# Patient Record
Sex: Female | Born: 1970 | Race: White | Hispanic: No | Marital: Married | Smoking: Never smoker
Health system: Southern US, Community
[De-identification: ages and names within clinical notes are randomized; demographics above are authoritative.]

## PROBLEM LIST (undated history)

## (undated) DIAGNOSIS — E785 Hyperlipidemia, unspecified: Secondary | ICD-10-CM

## (undated) DIAGNOSIS — T7840XA Allergy, unspecified, initial encounter: Secondary | ICD-10-CM

## (undated) DIAGNOSIS — K602 Anal fissure, unspecified: Secondary | ICD-10-CM

## (undated) HISTORY — PX: AUGMENTATION MAMMAPLASTY: SUR837

## (undated) HISTORY — DX: Anal fissure, unspecified: K60.2

## (undated) HISTORY — DX: Allergy, unspecified, initial encounter: T78.40XA

## (undated) HISTORY — DX: Hyperlipidemia, unspecified: E78.5

## (undated) HISTORY — PX: ANAL FISSURECTOMY: SUR608

---

## 1999-10-03 ENCOUNTER — Inpatient Hospital Stay (HOSPITAL_COMMUNITY): Admission: AD | Admit: 1999-10-03 | Discharge: 1999-10-03 | Payer: Self-pay | Admitting: Obstetrics and Gynecology

## 1999-10-09 ENCOUNTER — Inpatient Hospital Stay (HOSPITAL_COMMUNITY): Admission: AD | Admit: 1999-10-09 | Discharge: 1999-10-13 | Payer: Self-pay | Admitting: *Deleted

## 2000-05-03 ENCOUNTER — Other Ambulatory Visit: Admission: RE | Admit: 2000-05-03 | Discharge: 2000-05-03 | Payer: Self-pay | Admitting: Gynecology

## 2001-04-11 ENCOUNTER — Ambulatory Visit (HOSPITAL_COMMUNITY): Admission: RE | Admit: 2001-04-11 | Discharge: 2001-04-11 | Payer: Self-pay | Admitting: General Surgery

## 2001-06-02 ENCOUNTER — Other Ambulatory Visit: Admission: RE | Admit: 2001-06-02 | Discharge: 2001-06-02 | Payer: Self-pay | Admitting: Gynecology

## 2002-06-05 ENCOUNTER — Other Ambulatory Visit: Admission: RE | Admit: 2002-06-05 | Discharge: 2002-06-05 | Payer: Self-pay | Admitting: Gynecology

## 2003-06-14 ENCOUNTER — Other Ambulatory Visit: Admission: RE | Admit: 2003-06-14 | Discharge: 2003-06-14 | Payer: Self-pay | Admitting: Gynecology

## 2004-07-07 ENCOUNTER — Other Ambulatory Visit: Admission: RE | Admit: 2004-07-07 | Discharge: 2004-07-07 | Payer: Self-pay | Admitting: Gynecology

## 2005-09-21 ENCOUNTER — Other Ambulatory Visit: Admission: RE | Admit: 2005-09-21 | Discharge: 2005-09-21 | Payer: Self-pay | Admitting: Gynecology

## 2007-09-27 ENCOUNTER — Other Ambulatory Visit: Admission: RE | Admit: 2007-09-27 | Discharge: 2007-09-27 | Payer: Self-pay | Admitting: Gynecology

## 2009-07-26 HISTORY — PX: ABDOMINAL HYSTERECTOMY: SHX81

## 2009-07-30 ENCOUNTER — Ambulatory Visit (HOSPITAL_COMMUNITY): Admission: RE | Admit: 2009-07-30 | Discharge: 2009-07-30 | Payer: Self-pay | Admitting: Obstetrics and Gynecology

## 2010-02-16 ENCOUNTER — Encounter (INDEPENDENT_AMBULATORY_CARE_PROVIDER_SITE_OTHER): Payer: Self-pay | Admitting: Obstetrics and Gynecology

## 2010-02-16 ENCOUNTER — Inpatient Hospital Stay (HOSPITAL_COMMUNITY): Admission: RE | Admit: 2010-02-16 | Discharge: 2010-02-19 | Payer: Self-pay | Admitting: Obstetrics and Gynecology

## 2010-10-10 LAB — CBC
HCT: 21.3 % — ABNORMAL LOW (ref 36.0–46.0)
HCT: 23.9 % — ABNORMAL LOW (ref 36.0–46.0)
HCT: 40 % (ref 36.0–46.0)
Hemoglobin: 13.8 g/dL (ref 12.0–15.0)
Hemoglobin: 7.4 g/dL — ABNORMAL LOW (ref 12.0–15.0)
Hemoglobin: 8.3 g/dL — ABNORMAL LOW (ref 12.0–15.0)
MCH: 32.5 pg (ref 26.0–34.0)
MCH: 32.9 pg (ref 26.0–34.0)
MCH: 33.1 pg (ref 26.0–34.0)
MCHC: 34.6 g/dL (ref 30.0–36.0)
MCHC: 34.7 g/dL (ref 30.0–36.0)
MCHC: 34.8 g/dL (ref 30.0–36.0)
MCV: 94 fL (ref 78.0–100.0)
MCV: 94.8 fL (ref 78.0–100.0)
MCV: 95.3 fL (ref 78.0–100.0)
Platelets: 114 10*3/uL — ABNORMAL LOW (ref 150–400)
Platelets: 131 10*3/uL — ABNORMAL LOW (ref 150–400)
Platelets: 214 10*3/uL (ref 150–400)
RBC: 2.24 MIL/uL — ABNORMAL LOW (ref 3.87–5.11)
RBC: 2.51 MIL/uL — ABNORMAL LOW (ref 3.87–5.11)
RBC: 4.25 MIL/uL (ref 3.87–5.11)
RDW: 12.6 % (ref 11.5–15.5)
RDW: 12.7 % (ref 11.5–15.5)
RDW: 13 % (ref 11.5–15.5)
WBC: 3.6 10*3/uL — ABNORMAL LOW (ref 4.0–10.5)
WBC: 5.4 10*3/uL (ref 4.0–10.5)
WBC: 6.9 10*3/uL (ref 4.0–10.5)

## 2010-10-10 LAB — BASIC METABOLIC PANEL
BUN: 2 mg/dL — ABNORMAL LOW (ref 6–23)
CO2: 29 mEq/L (ref 19–32)
Calcium: 7.9 mg/dL — ABNORMAL LOW (ref 8.4–10.5)
Chloride: 102 mEq/L (ref 96–112)
Creatinine, Ser: 0.62 mg/dL (ref 0.4–1.2)
GFR calc Af Amer: >60 mL/min (ref >60)
GFR calc non Af Amer: >60 mL/min (ref >60)
Glucose, Bld: 125 mg/dL — ABNORMAL HIGH (ref 70–99)
Potassium: 3.5 mEq/L (ref 3.5–5.1)
Sodium: 135 mEq/L (ref 135–145)

## 2010-10-10 LAB — DIFFERENTIAL
Basophils Absolute: 0 10*3/uL (ref 0.0–0.1)
Basophils Relative: 0 % (ref 0–1)
Eosinophils Absolute: 0.1 10*3/uL (ref 0.0–0.7)
Eosinophils Relative: 1 % (ref 0–5)
Lymphocytes Relative: 17 % (ref 12–46)
Lymphs Abs: 0.9 10*3/uL (ref 0.7–4.0)
Monocytes Absolute: 0.4 10*3/uL (ref 0.1–1.0)
Monocytes Relative: 8 % (ref 3–12)
Neutro Abs: 4 10*3/uL (ref 1.7–7.7)
Neutrophils Relative %: 74 % (ref 43–77)

## 2010-10-10 LAB — LIPID PANEL
Cholesterol: 218 mg/dL — ABNORMAL HIGH (ref 0–200)
HDL: 80 mg/dL (ref >39)
LDL Cholesterol: 130 mg/dL — ABNORMAL HIGH (ref 0–99)
Total CHOL/HDL Ratio: 2.7 RATIO
Triglycerides: 42 mg/dL (ref <150)
VLDL: 8 mg/dL (ref 0–40)

## 2010-10-10 LAB — PREGNANCY, URINE: Preg Test, Ur: NEGATIVE

## 2010-10-10 LAB — SURGICAL PCR SCREEN
MRSA, PCR: NEGATIVE
Staphylococcus aureus: NEGATIVE

## 2010-10-11 LAB — CBC
HCT: 38.5 % (ref 36.0–46.0)
Hemoglobin: 13.3 g/dL (ref 12.0–15.0)
MCHC: 34.5 g/dL (ref 30.0–36.0)
MCV: 94.6 fL (ref 78.0–100.0)
Platelets: 204 10*3/uL (ref 150–400)
RBC: 4.07 MIL/uL (ref 3.87–5.11)
RDW: 13.1 % (ref 11.5–15.5)
WBC: 4.2 10*3/uL (ref 4.0–10.5)

## 2010-10-11 LAB — PREGNANCY, URINE: Preg Test, Ur: NEGATIVE

## 2010-12-11 NOTE — Discharge Summary (Signed)
[facility]  Patient:    Meghan Gonzales                      MRN: [id number] Adm. Date:  [id number] Disc. Date: [id number] Attending:  Osborn Coho Dictator:   Leilani Able, P.A.                           Discharge Summary  FINAL DIAGNOSES:              1. Intrauterine pregnancy at 40 weeks estimated                                  gestational age.                               2. Cephalopelvic disproportion.  PROCEDURE:                    Primary low transverse cesarean section.  SURGEON:                      Mark E. Dareen Piano, M.D.  COMPLICATIONS:                None.  HISTORY OF PRESENT ILLNESS:   This 40 year old, gravida 3, para 0-0-2-0, presents at 40 weeks complaining of contractions.  While in the emergency room, the patient had spontaneous rupture of membranes with moderate meconium stained fluid.  The  patients prenatal course has been complicated by history of two spontaneous miscarriages.  The patient was treated with prostaglandin during the first trimester of this pregnancy for a luteal phase defect.  She was also noted to be Group B Strep positive.  She was admitted at this time and started on antibiotics for the Group B Strep and amnioinfusion was begun.  The patient had a very protracted labor curve.  She was having severe pain at the neck at this point. She was 9 cm dilated for about three hours.  The patient had been having this pain n the neck and was better after the epidural was placed.  Within several hours she began to complain of severe neck pain again.  We were not sure of the etiology. At this time it was discussed with the patient about cesarean section.  The patient decided to proceed with a cesarean section.  HOSPITAL COURSE:              She was taken to the operating room by [facility] E. Dareen Piano, M.D. where a primary low transverse cesarean section was performed with the delivery of an 8 pound 13  ounce female infant with Apgars of 8 and 9. Delivery went without complications.  The patients postoperative course was benign without significant fevers.  Her neck pain resolved.  She was felt ready for discharge n postoperative day #2.  She was sent home on postoperative day #3.  The patient as sent home on a regular diet, told to decrease activities, told to continue prenatal vitamins, and given Tylox one to two every four hours as needed for pain.  Also  told she could take some Motrin.  The patient is to follow up in the office in our weeks.  DISCHARGE LABORATORY DATA:    The patient had a hemoglobin of 10.6, white  blood  cell count of 13.6. DD:  10/30/99 TD:  10/30/99 Job: [id number]

## 2010-12-11 NOTE — Op Note (Signed)
[facility]  Patient:    Meghan Gonzales Visit Number: [id number] MRN: [id number]          Service Type: DSU Location: DAY Attending Physician:  Carson Myrtle Dictated by:   Sheppard Plumber Earlene Plater, M.D. Proc. Date: 04/11/01 Admit Date:  04/11/2001   CC:         [name]. Mezer, M.D.   Operative Report  PREOPERATIVE DIAGNOSIS:  Anal fissure.  POSTOPERATIVE DIAGNOSIS:  Multiple anal fissures.  OPERATION:  Repair of anal fissure.  SURGEON:  Timothy E. Earlene Plater, M.D.  ANESTHESIA:  General  INDICATION FOR PROCEDURE:  [name] was referred to me by Dr. Chevis Pretty. Apparently she has had symptoms of anal fissure since the spring of 2002. After a trial of conservative management, which she failed she has agreed to proceed with surgery and this has been carefully explained.  There is a question of irritable bowel syndrome.  DESCRIPTION OF PROCEDURE:  The patient was brought to the operating room and placed supine. Mask anesthesia provided.  She was placed in lithotomy.  Area inspected and prepped and draped in the usual fashion.  Fissures were present in the 10, 12, and 6 oclock positions.  When seen in the office, she only had a posterior anal fissure.  I have to consider concurrent inflammatory bowel disease and/or trauma.  The sphincters were intact.  Anoscopy was otherwise negative.  The three fissures were lightly cauterized.  Marcaine 0.5% with epinephrine was injected around about the anal orifice.  A left posterior internal sphincterotomy carefully performed percutaneously with a 15 blade. The external sphincter remained intact.  There was no bleeding or complication.  Gelfoam gauze and a dry sterile dressing was applied.  She tolerated it well.  She was removed to recovery room in good condition.  Written and verbal instructions were carefully given to her and her husband including Ambien 10 mg, #15 one at night if needed for sleep and  Darvocet #36, refill one for pain. Dictated by:   Sheppard Plumber Earlene Plater, M.D. Attending Physician:  Carson Myrtle DD:  04/11/01 TD:  04/11/01 Job: [id number]

## 2010-12-11 NOTE — Op Note (Signed)
[facility]  Patient:    Meghan Gonzales                      MRN: [id number] Adm. Date:  [id number] Attending:  Osborn Coho CC:         [name]. Mezer, M.D.                           Operative Report  PREOPERATIVE DIAGNOSES:       1. Intrauterine pregnancy at 40-weeks estimated                                  gestational age.                               2. Cephalopelvic disproportion.  POSTOPERATIVE DIAGNOSES:      1. Intrauterine pregnancy at 40-weeks estimated                                  gestational age.                               2. Cephalopelvic disproportion.  PROCEDURE:                    Primary low transverse cesarean section.  SURGEON:                      Mark E. Dareen Piano, M.D.  ANESTHESIA:                   Epidural and spinal.  ESTIMATED BLOOD LOSS:         900 cc.  COMPLICATIONS:                None.  FINDINGS:                     Patient had a normal-appearing uterus, fallopian tubes and ovaries.  The placenta appeared to be normal.  The baby had Apgars of 9 at one minute and 9 at five minutes.  DESCRIPTION OF PROCEDURE:     Patient was taken to the operating room where a spinal anesthetic was placed because of inadequate pain relief from an epidural. Once the spinal dose was set up, the patient was prepped with Hibiclens and draped in the usual fashion for this procedure.  A Foley catheter had previously been placed.  A Pfannenstiel incision was made 2 cm above the pubic symphysis.  This was carried down to the fascia.  The fascia was entered in the midline and extended  laterally with the Mayo scissors.  Rectus muscles were then sharply dissected from the fascia.  Rectus muscle was divided in the midline and taken superiorly and inferiorly.  Parietal peritoneum was entered sharply.  A bladder flap was taken  down sharply.  A low transverse uterine incision was made in the midline and extended  laterally with blunt dissection.  Amniotic fluid appeared to be lightly stained with meconium.  On delivery of the head, oropharynx and nostrils were bulb-suctioned.  The remaining infant was then delivered, the cord was doubly clamped and cut and the infant handed  to the awaiting NICU team.  Cord blood was then obtained.  The placenta was then manually removed.  The uterus was exteriorized and uterine cavity wiped with a wet lap.  The uterine incision was  closed in a single layer of 0 chromic in a running-locking fashion.  Bladder flap and parietal peritoneum were not closed.  The posterior cul-de-sac and abdominal gutters were irrigated.  Hemostasis was checked and felt to be adequate.  The fascia was then closed using 0 Monocryl in a running fashion.  Subcuticular tissue was made hemostatic with a Bovie.  Stainless steel clips were used to close the  skin.  Patient tolerated the procedure well.  She was taken to the recovery room in stable condition.  Instrument and lap counts were correct x 2. DD:  10/10/99 TD:  10/12/99 Job: [id number]

## 2011-09-21 ENCOUNTER — Ambulatory Visit (INDEPENDENT_AMBULATORY_CARE_PROVIDER_SITE_OTHER): Payer: BC Managed Care – PPO | Admitting: General Surgery

## 2011-09-21 ENCOUNTER — Encounter (INDEPENDENT_AMBULATORY_CARE_PROVIDER_SITE_OTHER): Payer: Self-pay | Admitting: General Surgery

## 2011-09-21 VITALS — BP 106/74 | HR 70 | Temp 97.8°F | Resp 16 | Ht 71.0 in | Wt 154.8 lb

## 2011-09-21 DIAGNOSIS — K602 Anal fissure, unspecified: Secondary | ICD-10-CM | POA: Insufficient documentation

## 2011-09-21 NOTE — Patient Instructions (Signed)
Baby wipes after bm's Warm tub soaks 2-3 times per day Diltiazem cream to rectum 4 times a day Stool softener for next couple weeks

## 2011-10-05 NOTE — Progress Notes (Signed)
Subjective:     Patient ID: Meghan Gonzales, female   DOB: [birth year], 41 y.o.   MRN: [id number]  HPI We are asked to see the patient in consultation by Dr. Jerre Simon to evaluate her for an anal fissure. The patient is a 41 year old female who has been experiencing perirectal pain since about 2002. The pain seems to come and go. At the end of the summer she developed 4 days of constipation and hard bowel movements. She has been bleeding with her bowel movements now since about August. She denies any fevers or chills. No chest pain or shortness of breath. Her constipation is now under control.  Review of Systems  Constitutional: Negative.   HENT: Negative.   Eyes: Negative.   Respiratory: Negative.   Cardiovascular: Negative.   Gastrointestinal: Positive for anal bleeding and rectal pain.  Genitourinary: Negative.   Musculoskeletal: Negative.   Skin: Negative.   Neurological: Negative.   Hematological: Negative.   Psychiatric/Behavioral: Negative.        Objective:   Physical Exam  Constitutional: She is oriented to person, place, and time. She appears well-developed and well-nourished.  HENT:  Head: Normocephalic and atraumatic.  Eyes: Conjunctivae and EOM are normal. Pupils are equal, round, and reactive to light.  Neck: Normal range of motion. Neck supple.  Cardiovascular: Normal rate, regular rhythm and normal heart sounds.   Pulmonary/Chest: Effort normal and breath sounds normal.  Abdominal: Soft. Bowel sounds are normal.  Genitourinary:       Her perirectal skin looks good. She does have a visible fissure posteriorly  Musculoskeletal: Normal range of motion.  Neurological: She is alert and oriented to person, place, and time.  Skin: Skin is warm and dry.  Psychiatric: She has a normal mood and affect. Her behavior is normal.       Assessment:     Posterior anal fissure    Plan:     I recommended some local wound care measures as well as diltiazem cream to  place on the anal verge 4 times a day. We will plan to see her back in one month to check her progress.

## 2011-10-19 ENCOUNTER — Encounter (INDEPENDENT_AMBULATORY_CARE_PROVIDER_SITE_OTHER): Payer: BC Managed Care – PPO | Admitting: General Surgery

## 2011-11-25 ENCOUNTER — Encounter (INDEPENDENT_AMBULATORY_CARE_PROVIDER_SITE_OTHER): Payer: Self-pay | Admitting: General Surgery

## 2011-11-25 ENCOUNTER — Ambulatory Visit (INDEPENDENT_AMBULATORY_CARE_PROVIDER_SITE_OTHER): Payer: BC Managed Care – PPO | Admitting: General Surgery

## 2011-11-25 VITALS — BP 98/70 | HR 68 | Temp 97.8°F | Resp 12 | Ht 71.0 in | Wt 153.6 lb

## 2011-11-25 DIAGNOSIS — K602 Anal fissure, unspecified: Secondary | ICD-10-CM

## 2011-11-25 MED ORDER — NITROGLYCERIN 0.4 % RE OINT: 1.0000 "application " | TOPICAL_OINTMENT | Freq: Four times a day (QID) | RECTAL | Status: DC

## 2011-11-25 NOTE — Patient Instructions (Signed)
Try nitroglycerin or aquaphor miralax for constipation

## 2011-11-25 NOTE — Progress Notes (Signed)
Subjective:     Patient ID: Meghan Gonzales, female   DOB: 1971/04/05, 41 y.o.   MRN: [id number]  HPI The patient is a 41 year old white female who we saw just over a month ago with a posterior anal fissure. We treated her with diltiazem cream. She uses for about a month and then has not used it much since then. She claims that it causes some burning when she applies it. She is improved from when we saw her originally. She only occasionally experiences bleeding with her bowel movements and when she does have pain it is significantly less.  Review of Systems     Objective:   Physical Exam On rectal exam she still has some evidence of a posterior anal fissure but it actually appears to be healing.    Assessment:     Posterior anal fissure    Plan:     Since the diltiazem cream seems to cause some burning for her we will try switching her to a nitroglycerin cream and see if this helps. We will continue this for about a month and see how much more she can improve. If the nitroglycerin does not seem to work for her she can try displacing some Aquaphor back there on a daily basis.

## 2011-12-27 ENCOUNTER — Encounter (INDEPENDENT_AMBULATORY_CARE_PROVIDER_SITE_OTHER): Payer: BC Managed Care – PPO | Admitting: General Surgery

## 2012-02-10 ENCOUNTER — Encounter (INDEPENDENT_AMBULATORY_CARE_PROVIDER_SITE_OTHER): Payer: BC Managed Care – PPO | Admitting: General Surgery

## 2012-02-16 ENCOUNTER — Telehealth: Payer: Self-pay | Admitting: Internal Medicine

## 2012-02-16 NOTE — Telephone Encounter (Signed)
Spoke with Clydie Braun and scheduled patient on 02/21/12 at 10:30 AM

## 2012-02-16 NOTE — Telephone Encounter (Signed)
Left a message for Clydie Braun to call me.

## 2012-02-21 ENCOUNTER — Encounter: Payer: Self-pay | Admitting: Gastroenterology

## 2012-02-21 ENCOUNTER — Encounter: Payer: Self-pay | Admitting: Nurse Practitioner

## 2012-02-21 ENCOUNTER — Ambulatory Visit (INDEPENDENT_AMBULATORY_CARE_PROVIDER_SITE_OTHER): Payer: Self-pay | Admitting: Nurse Practitioner

## 2012-02-21 VITALS — BP 96/66 | HR 68 | Ht 71.0 in | Wt 153.4 lb

## 2012-02-21 DIAGNOSIS — Z8371 Family history of colonic polyps: Secondary | ICD-10-CM | POA: Insufficient documentation

## 2012-02-21 DIAGNOSIS — K625 Hemorrhage of anus and rectum: Secondary | ICD-10-CM | POA: Insufficient documentation

## 2012-02-21 MED ORDER — MOVIPREP 100 G PO SOLR: 1.0000 | Freq: Once | ORAL | Status: AC

## 2012-02-21 NOTE — Patient Instructions (Addendum)
We have scheduled the colonosocopy with Dr. Russella Dar. Directions provided. We sent a prescription to [facility] for the Moviprep.  Colonoscopy A colonoscopy is an exam to evaluate your entire colon. In this exam, your colon is cleansed. A long fiberoptic tube is inserted through your rectum and into your colon. The fiberoptic scope (endoscope) is a long bundle of enclosed and very flexible fibers. These fibers transmit light to the area examined and send images from that area to your caregiver. Discomfort is usually minimal. You may be given a drug to help you sleep (sedative) during or prior to the procedure. This exam helps to detect lumps (tumors), polyps, inflammation, and areas of bleeding. Your caregiver may also take a small piece of tissue (biopsy) that will be examined under a microscope. LET YOUR CAREGIVER KNOW ABOUT:   Allergies to food or medicine.   Medicines taken, including vitamins, herbs, eyedrops, over-the-counter medicines, and creams.   Use of steroids (by mouth or creams).   Previous problems with anesthetics or numbing medicines.   History of bleeding problems or blood clots.   Previous surgery.   Other health problems, including diabetes and kidney problems.   Possibility of pregnancy, if this applies.  BEFORE THE PROCEDURE   A clear liquid diet may be required for 2 days before the exam.   Ask your caregiver about changing or stopping your regular medications.   Liquid injections (enemas) or laxatives may be required.   A large amount of electrolyte solution may be given to you to drink over a short period of time. This solution is used to clean out your colon.   You should be present 60 minutes prior to your procedure or as directed by your caregiver.  AFTER THE PROCEDURE   If you received a sedative or pain relieving medication, you will need to arrange for someone to drive you home.   Occasionally, there is a little blood passed with the first  bowel movement. Do not be concerned.  FINDING OUT THE RESULTS OF YOUR TEST Not all test results are available during your visit. If your test results are not back during the visit, make an appointment with your caregiver to find out the results. Do not assume everything is normal if you have not heard from your caregiver or the medical facility. It is important for you to follow up on all of your test results. HOME CARE INSTRUCTIONS   It is not unusual to pass moderate amounts of gas and experience mild abdominal cramping following the procedure. This is due to air being used to inflate your colon during the exam. Walking or a warm pack on your belly (abdomen) may help.   You may resume all normal meals and activities after sedatives and medicines have worn off.   Only take over-the-counter or prescription medicines for pain, discomfort, or fever as directed by your caregiver. Do not use aspirin or blood thinners if a biopsy was taken. Consult your caregiver for medicine usage if biopsies were taken.  SEEK IMMEDIATE MEDICAL CARE IF:   You have a fever.   You pass large blood clots or fill a toilet with blood following the procedure. This may also occur 10 to 14 days following the procedure. This is more likely if a biopsy was taken.   You develop abdominal pain that keeps getting worse and cannot be relieved with medicine.  Document Released: 07/09/2000 Document Revised: 07/01/2011 Document Reviewed: 02/22/2008 [facility] Patient Information 2012 [place], [place].

## 2012-02-21 NOTE — Progress Notes (Signed)
02/21/2012 Meghan Gonzales [id number] [birth year]   HISTORY OF PRESENT ILLNESS: Patient is a healthy 41 year old female, new to this practice, here for evaluation of rectal bleeding. Approximately 12 years ago after giving birth, patient developed several anal fissures. She ultimately underwent surgery by Dr. Earlene Plater who has since retired. Patient did well until about a year ago when she developed another fissure following a bout of constipation. No further constipation, patient is taking stool softeners 3 times a week. Patient saw surgery (Dr. Carolynne Edouard)  in late May of this year and was tried on diltiazem followed by nitroglycerin. Overall she is doing okay right now. Patient is concerned about the frequent episodes of rectal bleeding which she has been attributing to fissure for the last year. Patient's mother had colon polyps in her forties.   Past Medical History  Diagnosis Date  . Hyperlipidemia   . Constipation   . Anal fissure    Past Surgical History  Procedure Date  . Abdominal hysterectomy 2011  . Cesarean section 10/10/1999  . Anal fissurectomy     reports that she has never smoked. She has never used smokeless tobacco. She reports that she drinks alcohol. She reports that she does not use illicit drugs. family history includes Diabetes in her father. No Known Allergies    Outpatient Encounter Prescriptions as of 02/21/2012  Medication Sig Dispense Refill  . fish oil-omega-3 fatty acids 1000 MG capsule Take 2 g by mouth 3 (three) times daily.      . Multiple Vitamin (MULTIVITAMIN) tablet Take 1 tablet by mouth daily.      Marland Kitchen DISCONTD: Nitroglycerin 0.4 % OINT Place 1 application rectally 4 (four) times daily.  30 g  3     REVIEW OF SYSTEMS  : All other systems reviewed and negative except where noted in the History of Present Illness.   PHYSICAL EXAM: BP 96/66  Pulse 68  Ht 5\' 11"  (1.803 m)  Wt 153 lb 6.4 oz (69.582 kg)  BMI 21.40 kg/m2 General: Well developed white female in  no acute distress Head: Normocephalic and atraumatic Eyes:  sclerae anicteric,conjunctive pink. Ears: Normal auditory acuity Mouth: No deformity or lesions Neck: Supple, no masses.  Lungs: Clear throughout to auscultation Heart: Regular rate and rhythm; no murmurs heard Abdomen: Soft, non distended, nontender. No masses or hepatomegaly noted. Normal Bowel sounds Rectal: no fissure appreciated Musculoskeletal: Symmetrical with no gross deformities  Skin: No lesions on visible extremities Extremities: No edema or deformities noted Neurological: Alert oriented x 4, grossly nonfocal Cervical Nodes:  No significant cervical adenopathy Psychological:  Alert and cooperative. Normal mood and affect  ASSESSMENT AND PLAN:   1. Rectal bleeding. Though I cannot appreciate a fissure on today's exam, she has had problems with seizures in the past, her last one was in the spring. Bleeding probably perianal in nature but given family history of colon polyps in mother it would be reasonable to proceed with colonoscopy to rule out other sources of bleeding such as polyps/neoplasm.  2. history of anal fissures, patient evaluated by surgery late May and was treated with diltiazem followed by nitroglycerin. I do not appreciate a fissure on today's exam and she is not having fissure type pain.

## 2012-02-21 NOTE — Progress Notes (Signed)
Reviewed and agree with management plan. Jos Cygan T. Melvin Whiteford MD FACG 

## 2012-02-22 ENCOUNTER — Encounter (INDEPENDENT_AMBULATORY_CARE_PROVIDER_SITE_OTHER): Payer: BC Managed Care – PPO | Admitting: General Surgery

## 2012-02-22 ENCOUNTER — Telehealth: Payer: Self-pay | Admitting: *Deleted

## 2012-02-22 NOTE — Telephone Encounter (Signed)
Called and left a message for the pt. I advised the CRNA looked at the list of medications used to sedate her when she had a procedure with Dr. Benna Dunks.  Versed, Demerol, Phentynol, Deprovan ( propofol), Decadron ( steroid) Sevo Flurone, Zofran.  John the CRNA said none of these medications are know to cause hair loss.  I asked her to  Call and let me know she got my message.

## 2012-02-25 ENCOUNTER — Telehealth: Payer: Self-pay | Admitting: *Deleted

## 2012-02-25 NOTE — Telephone Encounter (Signed)
The patient called me back after I had left her a message earlier this week.  I advised her I spoke to one of the CRNA's here. I had him read the list of meds that were used to sedate her when she had a procedure with Dr. Benna Dunks.  I read her the list that they emailed to me with her permission.  The CRNA said none of the meds on that list are associated with hair loss that he knew. She is going to call Dr Lynnell Dike office  And try to get the meds that were used when she had the two Ablations.

## 2012-03-27 ENCOUNTER — Telehealth: Payer: Self-pay | Admitting: Internal Medicine

## 2012-03-27 NOTE — Telephone Encounter (Signed)
Having seasonal allergies - ? Ok to use benadryl prior to colonoscopy   Ok to do so

## 2012-03-28 ENCOUNTER — Encounter: Payer: Self-pay | Admitting: Gastroenterology

## 2012-03-28 ENCOUNTER — Ambulatory Visit (AMBULATORY_SURGERY_CENTER): Payer: BC Managed Care – PPO | Admitting: Gastroenterology

## 2012-03-28 VITALS — BP 95/66 | HR 57 | Temp 96.0°F | Resp 18 | Ht 71.0 in | Wt 153.0 lb

## 2012-03-28 DIAGNOSIS — K625 Hemorrhage of anus and rectum: Secondary | ICD-10-CM

## 2012-03-28 DIAGNOSIS — D126 Benign neoplasm of colon, unspecified: Secondary | ICD-10-CM

## 2012-03-28 DIAGNOSIS — Z8371 Family history of colonic polyps: Secondary | ICD-10-CM

## 2012-03-28 DIAGNOSIS — K921 Melena: Secondary | ICD-10-CM

## 2012-03-28 MED ORDER — SODIUM CHLORIDE 0.9 % IV SOLN: 500.0000 mL | INTRAVENOUS | Status: DC

## 2012-03-28 NOTE — Progress Notes (Signed)
Patient did not experience any of the following events: a burn prior to discharge; a fall within the facility; wrong site/side/patient/procedure/implant event; or a hospital transfer or hospital admission upon discharge from the facility. (G8907) Patient did not have preoperative order for IV antibiotic SSI prophylaxis. (G8918)  

## 2012-03-28 NOTE — Op Note (Signed)
[facility] [address] [place], [address]   COLONOSCOPY PROCEDURE REPORT  PATIENT: Meghan Gonzales, Meghan Gonzales  MR#: [id number] BIRTHDATE: 10-31-1970 , 41  yrs. old GENDER: Female ENDOSCOPIST: Meryl Dare, MD, [facility] REFERRED [id number] Vincente Poli, M.D. PROCEDURE DATE:  03/28/2012 PROCEDURE:   Colonoscopy with snare polypectomy ASA CLASS:   Class II INDICATIONS:hematochezia. MEDICATIONS: MAC sedation, administered by CRNA and propofol (Diprivan) 250mg  IV DESCRIPTION OF PROCEDURE:   After the risks benefits and alternatives of the procedure were thoroughly explained, informed consent was obtained.  A digital rectal exam revealed no abnormalities of the rectum.   The LB [id number]  endoscope was introduced through the anus and advanced to the cecum, which was identified by both the appendix and ileocecal valve. No adverse events experienced.   The quality of the prep was excellent, using MoviPrep  The instrument was then slowly withdrawn as the colon was fully examined.   COLON FINDINGS: A sessile polyp measuring 7 mm in size was found in the transverse colon.  A polypectomy was performed using snare cautery.  The resection was complete and the polyp tissue was completely retrieved.   A sessile polyp measuring 5 mm in size was found in the transverse colon.  A polypectomy was performed with a cold snare.  The resection was complete and the polyp tissue was completely retrieved.   A sessile polyp measuring 5 mm in size was found in the descending colon.  A polypectomy was performed with a cold snare.  The resection was complete and the polyp tissue was not retrieved.   The colon was otherwise normal.  There was no diverticulosis, inflamation, polyps or cancers unless previously stated.  Retroflexed views revealed internal hemorrhoids. The time to cecum=3 minutes 19 seconds.  Withdrawal time=10 minutes 45 seconds.  The scope was withdrawn and the procedure  completed. COMPLICATIONS: There were no complications.  ENDOSCOPIC IMPRESSION: 1.   Sessile polyp in the transverse colon; polypectomy performed 2.   Sessile polyp in the transverse colon; polypectomy performed 3.   Sessile polyp in the descending colon; polypectomy performed 4.   Small internal hemorrhoids  RECOMMENDATIONS: 1.  Hold aspirin, aspirin products, and anti-inflammatory medication for 2 weeks. 2.  Await pathology results 3.  Repeat colonoscopy in 5 years if polyp adenomatous; otherwise 10 years eSigned:  Meryl Dare, MD, [facility] 03/28/2012 11:47 AM

## 2012-03-28 NOTE — Patient Instructions (Signed)
YOU HAD AN ENDOSCOPIC PROCEDURE TODAY AT THE [facility]: Refer to the procedure report that was given to you for any specific questions about what was found during the examination.  If the procedure report does not answer your questions, please call your gastroenterologist to clarify.  If you requested that your care partner not be given the details of your procedure findings, then the procedure report has been included in a sealed envelope for you to review at your convenience later.  YOU SHOULD EXPECT: Some feelings of bloating in the abdomen. Passage of more gas than usual.  Walking can help get rid of the air that was put into your GI tract during the procedure and reduce the bloating. If you had a lower endoscopy (such as a colonoscopy or flexible sigmoidoscopy) you may notice spotting of blood in your stool or on the toilet paper. If you underwent a bowel prep for your procedure, then you may not have a normal bowel movement for a few days.  DIET: Your first meal following the procedure should be a light meal and then it is ok to progress to your normal diet.  A half-sandwich or bowl of soup is an example of a good first meal.  Heavy or fried foods are harder to digest and may make you feel nauseous or bloated.  Likewise meals heavy in dairy and vegetables can cause extra gas to form and this can also increase the bloating.  Drink plenty of fluids but you should avoid alcoholic beverages for 24 hours.  ACTIVITY: Your care partner should take you home directly after the procedure.  You should plan to take it easy, moving slowly for the rest of the day.  You can resume normal activity the day after the procedure however you should NOT DRIVE or use heavy machinery for 24 hours (because of the sedation medicines used during the test).    SYMPTOMS TO REPORT IMMEDIATELY: A gastroenterologist can be reached at any hour.  During normal business hours, 8:30 AM to 5:00 PM Monday through Friday,  call [phone].  After hours and on weekends, please call the GI answering service at [phone] who will take a message and have the physician on call contact you.   Following lower endoscopy (colonoscopy or flexible sigmoidoscopy):  Excessive amounts of blood in the stool  Significant tenderness or worsening of abdominal pains  Swelling of the abdomen that is new, acute  Fever of 100F or higher   FOLLOW UP: If any biopsies were taken you will be contacted by phone or by letter within the next 1-3 weeks.  Call your gastroenterologist if you have not heard about the biopsies in 3 weeks.  Our staff will call the home number listed on your records the next business day following your procedure to check on you and address any questions or concerns that you may have at that time regarding the information given to you following your procedure. This is a courtesy call and so if there is no answer at the home number and we have not heard from you through the emergency physician on call, we will assume that you have returned to your regular daily activities without incident.  SIGNATURES/CONFIDENTIALITY: You and/or your care partner have signed paperwork which will be entered into your electronic medical record.  These signatures attest to the fact that that the information above on your After Visit Summary has been reviewed and is understood.  Full responsibility of the confidentiality of   this discharge information lies with you and/or your care-partner.     HOLD ASPIRIN ,ASPIRIN PRODUCTS, & ANTI INFLAMMATORY PRODUCTS FOR 2 WEEKS  AWAIT LETTER FROM DR Russella Dar WITH PATHOLOGY RESULTS

## 2012-03-29 ENCOUNTER — Telehealth: Payer: Self-pay | Admitting: *Deleted

## 2012-03-29 NOTE — Telephone Encounter (Signed)
Pt. Called office to see if she should resume her "fish oil" following procedure yesterday.  Advised that she could resume her  Dose of fish oil.  Pt. Verbalized understanding.

## 2012-03-29 NOTE — Telephone Encounter (Signed)
No answer.  Left message to call office if questions or concerns. 

## 2012-04-11 ENCOUNTER — Other Ambulatory Visit: Payer: Self-pay | Admitting: Obstetrics and Gynecology

## 2012-04-12 ENCOUNTER — Encounter: Payer: Self-pay | Admitting: Gastroenterology

## 2012-04-14 ENCOUNTER — Other Ambulatory Visit: Payer: Self-pay | Admitting: Obstetrics and Gynecology

## 2012-04-14 DIAGNOSIS — R928 Other abnormal and inconclusive findings on diagnostic imaging of breast: Secondary | ICD-10-CM

## 2012-04-28 ENCOUNTER — Telehealth: Payer: Self-pay | Admitting: Gastroenterology

## 2012-04-28 NOTE — Telephone Encounter (Signed)
Patient did not get a copy of the letter.  I have reviewed the results and sent another letter

## 2012-05-01 ENCOUNTER — Ambulatory Visit
Admission: RE | Admit: 2012-05-01 | Discharge: 2012-05-01 | Disposition: A | Discharge status: Home / Self Care | Payer: BC Managed Care – PPO | Source: Ambulatory Visit | Attending: Obstetrics and Gynecology | Admitting: Obstetrics and Gynecology

## 2012-05-01 DIAGNOSIS — R928 Other abnormal and inconclusive findings on diagnostic imaging of breast: Secondary | ICD-10-CM

## 2012-08-09 ENCOUNTER — Telehealth (INDEPENDENT_AMBULATORY_CARE_PROVIDER_SITE_OTHER): Payer: Self-pay

## 2012-08-09 NOTE — Telephone Encounter (Signed)
The pt called in to schedule an appointment to see Dr Carolynne Edouard again.  She has seen him before for a fissure.  She was told by her GI that she has 3 hemorrhoids.  She has some bleeding.  She also complains of perianal itching and asked for advice.  I asked her to do warm tub soaks at least 3 times a day, keep her skin clean and dry, and  avoid foods containing citrus.  I gave her an appointment for 1/23 at 1:50.

## 2012-08-17 ENCOUNTER — Ambulatory Visit (INDEPENDENT_AMBULATORY_CARE_PROVIDER_SITE_OTHER): Payer: BC Managed Care – PPO | Admitting: General Surgery

## 2012-08-17 ENCOUNTER — Encounter (INDEPENDENT_AMBULATORY_CARE_PROVIDER_SITE_OTHER): Payer: Self-pay | Admitting: General Surgery

## 2012-08-17 VITALS — BP 116/68 | HR 71 | Resp 16 | Ht 71.0 in | Wt 157.0 lb

## 2012-08-17 DIAGNOSIS — L290 Pruritus ani: Secondary | ICD-10-CM

## 2012-08-17 NOTE — Patient Instructions (Signed)
Use hydrocortisone cream and aquaphor to rectum 2-3 times a day

## 2012-09-19 ENCOUNTER — Encounter (INDEPENDENT_AMBULATORY_CARE_PROVIDER_SITE_OTHER): Payer: Self-pay | Admitting: General Surgery

## 2012-09-19 ENCOUNTER — Ambulatory Visit (INDEPENDENT_AMBULATORY_CARE_PROVIDER_SITE_OTHER): Payer: BC Managed Care – PPO | Admitting: General Surgery

## 2012-09-19 VITALS — BP 120/68 | HR 74 | Temp 98.0°F | Resp 18 | Ht 71.0 in | Wt 156.6 lb

## 2012-09-19 DIAGNOSIS — K602 Anal fissure, unspecified: Secondary | ICD-10-CM

## 2012-09-19 NOTE — Patient Instructions (Signed)
Fiber Chart  You should 25-30g of fiber per day and drinking 8 glasses of water to help your bowels move regularly.  In the chart below you can look up how much fiber you are getting in an average day.  If you are not getting enough fiber, you should add a fiber supplement to your diet.  Examples of this include Metamucil, FiberCon and Citrucel.  These can be purchased at your local grocery store or pharmacy.      [email or web address] GETTING TO GOOD BOWEL HEALTH. Irregular bowel habits such as constipation can lead to many problems over time.  Having one soft bowel movement a day is the most important way to prevent further problems.  The anorectal canal is designed to handle stretching and feces to safely manage our ability to get rid of solid waste (feces, poop, stool) out of our body.  BUT, hard constipated stools can act like ripping concrete bricks causing inflamed hemorrhoids, anal fissures, abdominal pain and bloating.     The goal: ONE SOFT BOWEL MOVEMENT A DAY!  To have soft, regular bowel movements:    Drink at least 8 tall glasses of water a day.     Take plenty of fiber.  Fiber is the undigested part of plant food that passes into the colon, acting s "natures broom" to encourage bowel motility and movement.  Fiber can absorb and hold large amounts of water. This results in a larger, bulkier stool, which is soft and easier to pass. Work gradually over several weeks up to 6 servings a day of fiber (25g a day even more if needed) in the form of: o Vegetables -- Root (potatoes, carrots, turnips), leafy green (lettuce, salad greens, celery, spinach), or cooked high residue (cabbage, broccoli, etc) o Fruit -- Fresh (unpeeled skin & pulp), Dried (prunes, apricots, cherries, etc ),  or stewed ( applesauce)  o Whole grain breads, pasta, etc (whole wheat)  o Bran cereals    Bulking Agents -- This type of water-retaining fiber generally is  easily obtained each day by one of the following:  o Psyllium bran -- The psyllium plant is remarkable because its ground seeds can retain so much water. This product is available as Metamucil, Konsyl, Effersyllium, Per Diem Fiber, or the less expensive generic preparation in drug and health food stores. Although labeled a laxative, it really is not a laxative.  o Methylcellulose -- This is another fiber derived from wood which also retains water. It is available as Citrucel. o Polyethylene Glycol - and "artificial" fiber commonly called Miralax or Glycolax.  It is helpful for people with gassy or bloated feelings with regular fiber o Flax Seed - a less gassy fiber than psyllium   No reading or other relaxing activity while on the toilet. If bowel movements take longer than 5 minutes, you are too constipated   AVOID CONSTIPATION.  High fiber and water intake usually takes care of this.  Sometimes a laxative is needed to stimulate more frequent bowel movements, but    Laxatives are not a good long-term solution as it can wear the colon out. o Osmotics (Milk of Magnesia, Fleets phosphosoda, Magnesium citrate, MiraLax, GoLytely) are safer than  o Stimulants (Senokot, Castor Oil, Dulcolax, Ex Lax)    o Do not take laxatives for more than 7days in a row.    IF SEVERELY CONSTIPATED, try a Bowel Retraining Program: o Do not use laxatives.  o Eat a diet high in roughage, such as bran cereals  and leafy vegetables.  o Drink six (6) ounces of prune or apricot juice each morning.  o Eat two (2) large servings of stewed fruit each day.  o Take one (1) heaping tablespoon of a psyllium-based bulking agent twice a day. Use sugar-free sweetener when possible to avoid excessive calories.  o Eat a normal breakfast.  o Set aside 15 minutes after breakfast to sit on the toilet, but do not strain to have a bowel movement.  o If you do not have a bowel movement by the third day, use an enema and repeat the above steps.      WHAT IS AN ANAL FISSURE? An anal fissure (fissure-in-ano) is a small, oval shaped tear in skin that lines the opening of the anus. Fissures typically cause severe pain and bleeding with bowel movements. Fissures are quite common in the general population, but are often confused with other causes of pain and bleeding, such as hemorrhoids. WHAT ARE THE SYMPTOMS OF AN ANAL FISSURE? The typical symptoms of an anal fissure include severe pain during, and especially after, a bowel movement, lasting from several minutes to a few hours. Patients may also notice bright red blood from the anus that can be seen on the toilet paper or on the stool. Between bowel movements, patients with anal fissures are often relatively symptom-free. Many patients are fearful of having a bowel movement and may try to avoid defecation secondary to the pain.  WHAT CAUSES AN ANAL FISSURE? Fissures are usually caused by trauma to the inner lining of the anus. Patients with tight anal sphincter muscles (i.e., increased muscle tone) are more prone to developing anal fissures. A hard, dry bowel movement is typically responsible, but loose stools and diarrhea can also be the cause. Following a bowel movement, severe anal pain can produce spasm of the anal sphincter muscle, resulting in a decrease in blood flow to the site of the injury, thus impairing healing of the wound. The next bowel movement results in more pain, anal spasm, decreased blood flow to the area, and the cycle continues. Treatments are aimed at interrupting this cycle by relaxing the anal sphincter muscle to promote healing of the fissure.  Other, less common, causes include inflammatory conditions and certain anal infections or tumors. Anal fissures may be acute (recent onset) or chronic (present for a long period of time). Chronic fissures may be more difficult to treat, and may also have an external lump associated with the tear, called a sentinel pile or skin tag, as  well as extra tissue just inside the anal canal (hypertrophied papilla) . WHAT IS THE TREATMENT OF ANAL FISSURES? The majority of anal fissures do not require surgery. The most common treatment for an acute anal fissure consists of making the stool more formed and bulky with a diet high in fiber and utilization of over-the-counter fiber supplementation (totaling 25-35 grams of fiber/day). Stool softeners and increasing water intake may be necessary to promote soft bowel movements and aid in the healing process. Topical anesthetics for pain and warm tub baths (sitz baths) for 10-20 minutes several times a day (especially after bowel movements) are soothing and promote relaxation of the anal muscles, which may help the healing process.  Other medications (such as nitroglycerin, nifedipine, or diltiazem) may be prescribed that allow relaxation of the anal sphincter muscles. Your surgeon will go over benefits and side-effects of each of these with you. Narcotic pain medications are not recommended for anal fissures, as they promote constipation. Chronic  fissures are generally more difficult to treat, and your surgeon may advise surgical treatment. WILL THE PROBLEM RETURN? Fissures can recur easily, and it is quite common for a fully healed fissure to recur after a hard bowel movement or other trauma. Even when the pain and bleeding have subsided, it is very important to continue good bowel habits and a diet high in fiber as a lifestyle change. If the problem returns without an obvious cause, further assessment is warranted. WHAT CAN BE DONE IF THE FISSURE DOES NOT HEAL? A fissure that fails to respond to conservative measures should be re-examined. Persistent hard or loose bowel movements, scarring, or spasm of the internal anal muscle all contribute to delayed healing. Other medical problems such as inflammatory bowel disease (Crohn's disease), infections, or anal tumors can cause symptoms similar to anal  fissures. Patients suffering from persistent anal pain should be examined to exclude these symptoms. This may include a colonoscopy or an exam in the operating room under anesthesia. WHAT DOES SURGERY INVOLVE? Surgical options for treating anal fissure include Botulinum toxin (Botox) injection into the anal sphincter and surgical division of a portion of the internal anal sphincter (lateral internal sphincterotomy). Both of these are performed typically as outpatient, same-day procedures, or occasionally in the office setting. The goal of these surgical options is to promote relaxation of the anal sphincter, thereby decreasing anal pain and spasm, allowing the fissure to heal. Botox injection results in healing in 50-80% of patients, while sphincterotomy is reported to be over 90% successful. If a sentinel pile is present, it may be removed to promote healing of the fissure. All surgical procedures carry some risk, and a sphincterotomy can rarely interfere with one's ability to control gas and stool. Your colon and rectal surgeon will discuss these risks with you to determine the appropriate treatment for your particular situation. HOW LONG IS THE RECOVERY AFTER SURGERY? It is important to note that complete healing with both medical and surgical treatments can take up to approximately 6-10 weeks. However, acute pain after surgery often disappears after a few days. Most patients will be able to return to work and resume daily activities in a few short days after the surgery. CAN FISSURES LEAD TO COLON CANCER? Absolutely not. Persistent symptoms, however, need careful evaluation since other conditions other than an anal fissure can cause similar symptoms. Your colon and rectal surgeon may request additional tests, even if your fissure has successfully healed. A colonoscopy may be required to exclude other causes of rectal bleeding. WHAT IS A COLON AND RECTAL SURGEON? Colon and rectal surgeons are experts in  the surgical and non-surgical treatment of diseases of the colon, rectum and anus. They have completed advanced surgical training in the treatment of these diseases as well as full general surgical training. Board-certified colon and rectal surgeons complete residencies in general surgery and colon and rectal surgery, and pass intensive examinations conducted by the American Board of Surgery and the American Board of Colon and Rectal Surgery. They are well-versed in the treatment of both benign and malignant diseases of the colon, rectum and anus and are able to perform routine screening examinations and surgically treat conditions if indicated to do so.  Author: Tyrone Apple. Pennie Banter, DO, on behalf of the [employer]   2012 American Society of Colon & Rectal Surgeons   Pruritus Ani  What is Pruritus Ani (proo-r-tus a-n)? Itching around the anal area is called pruritus ani. This condition results in a compelling  urge to scratch. What causes this to happen? Several factors may be at fault. A common cause is excessive moisture in the anal area. Moisture may be due to perspiration or a small amount of residual stool around the anal area. Pruritis ani may be a symptom of other common anal conditions such as hemorrhoids and anal fissures. The initial condition can be made worse by scratching, vigorous cleansing of the area or overuse of topical treatments. In some individuals pruritus ani may be caused by eating certain foods, smoking and drinking alcoholic beverages, especially beer and wine. Food items that have been associated with pruritus ani include: . Coffee, Tea . Carbonated beverages . Milk products . Tomatoes and tomato products such as Ketchup . Cheese . Chocolate . Nuts Does Pruritus Ani result from lack of cleanliness? Cleanliness is almost never a factor. However, the natural tendency once a person develops this itching is to wash the area vigorously and frequently  with soap and a washcloth. This almost always makes the problem worse by damaging the skin and washing away protective natural oils. What can be done to make this itching go away? A careful examination by a colon and rectal surgeon or other physician may identify a definite cause for the itching. Your physician can recommend treatment to eliminate the specific problem. Treatment of pruritus ani may include these three points. 1. AVOID MOISTURE in the anal area: . Apply either a few wisps of cotton, a 4 x 4 gauze or some cornstarch powder to keep the area dry. . Avoid all medicated, perfumed and deodorant powders. 2. AVOID FURTHER TRAUMA to the affected area: Marland Kitchen Do not use soap of any kind on the anal area. . Do not scrub the anal area with anything - even toilet paper. . For hygiene, it is best to rinse with warm water and pat the area dry. Use wet toilet paper, baby wipes or a wet washcloth to blot the area clean. Never rub. . Try not to scratch the itchy area. Scratching produces more damage, which in turn makes the itching worse. For individuals that experience irresistible itching at night, wearing socks on the hands may be helpful. 3. USE ONLY MEDICATIONS AS DIRECTED BY YOUR PHYSICIAN. Apply prescription medications sparingly to the skin around the anal area and avoid rubbing. Prolonged use of prescribed or over the counter topical medications may result in irritation or skin dryness that can make the condition worse. How long does this treatment usually take? Most people experience some relief from itching within a week. If symptoms do not resolve after 6 weeks, a follow-up appointment with your colon and rectal surgeon may be needed.               2012 American Society of Colon & Rectal Surgeons

## 2012-09-19 NOTE — Progress Notes (Signed)
No chief complaint on file.   HISTORY: Meghan Gonzales is a 42 y.o. female who presents to the office with rectal bleeding for 18 months.  Other symptoms include pain and itching.  She has had pain with hard BM's. She has tried multiple creams in the past with some success.  Hard Bm's makes the symptoms worse.   It is intermittent in nature.  Her bowel habits are irregular and her bowel movements are usually soft, occasionally soft.  Her fiber intake is minimal to moderate.  Her last colonoscopy was in Sept and 3 polyps were found.  She denies prolapsing tissue.     Past Medical History  Diagnosis Date  . Hyperlipidemia   . Constipation   . Anal fissure       Past Surgical History  Procedure Laterality Date  . Abdominal hysterectomy  2011  . Cesarean section  10/10/1999  . Anal fissurectomy          Current Outpatient Prescriptions  Medication Sig Dispense Refill  . fish oil-omega-3 fatty acids 1000 MG capsule Take 2 g by mouth 3 (three) times daily.      . Multiple Vitamin (MULTIVITAMIN) tablet Take 1 tablet by mouth daily.       No current facility-administered medications for this visit.      Allergies  Allergen Reactions  . Hydrocodone Itching      Family History  Problem Relation Age of Onset  . Diabetes Father   . Colon polyps Mother     History   Social History  . Marital Status: Married    Spouse Name: N/A    Number of Children: N/A  . Years of Education: N/A   Social History Main Topics  . Smoking status: Never Smoker   . Smokeless tobacco: Never Used  . Alcohol Use: Yes     Comment: glass of wine per day  . Drug Use: No  . Sexually Active: None   Other Topics Concern  . None   Social History Narrative  . None      REVIEW OF SYSTEMS - PERTINENT POSITIVES ONLY: Review of Systems - General ROS: negative for - chills, fever or weight loss Hematological and Lymphatic ROS: negative for - bleeding problems, blood clots or bruising Respiratory ROS: no  cough, shortness of breath, or wheezing Cardiovascular ROS: no chest pain or dyspnea on exertion Gastrointestinal ROS: positive for - blood in stools and constipation negative for - abdominal pain or nausea/vomiting Genito-Urinary ROS: no dysuria, trouble voiding, or hematuria  EXAM: Filed Vitals:   09/19/12 1116  BP: 120/68  Pulse: 74  Temp: 98 F (36.7 C)  Resp: 18    General appearance: alert and cooperative Resp: clear to auscultation bilaterally Cardio: regular rate and rhythm GI: soft, non-tender; bowel sounds normal; no masses,  no organomegaly   Procedure: Anoscopy Surgeon: Maisie Fus Diagnosis: rectal bleeding  Assistant: Christella Scheuermann After the risks and benefits were explained, verbal consent was obtained for above procedure  Anesthesia: none Findings: shallow post anal fissure, no sphincter hypertension, no hemorrhoids    ASSESSMENT AND PLAN: Meghan Gonzales is a 42 y.o. F who is s/p a lat int sphincterotomy in 2002 by Dr Earlene Plater.  She appears to have a resolving anal fissure on exam.  I do not see any hemorrhoidal disease.  We discussed conservative measures to help this area heal.  Given her negative colonoscopy, I think it is safe to say that her bleeding is most likely coming from her fissure.  I have recommended that she find a fiber supplement for her regularity so that she is not so dependent on laxatives, as these can sometimes make fissures worse.  I would not recommend repeating her sphincterotomy at any time within the future.     Vanita Panda, MD Colon and Rectal Surgery / General Surgery [facility]      Visit Diagnoses: No diagnosis found.  Primary Care Physician: Thora Lance, MD

## 2012-09-20 NOTE — Progress Notes (Signed)
Subjective:     Patient ID: Meghan Gonzales, female   DOB: Mar 24, 1971, 42 y.o.   MRN: [id number]  HPI The patient is a 42 year old white female who we have seen in the past with an anal fissure. She has continued to have some occasional bleeding with her bowel movements. Mostly what she complains of is burning and itching in her perirectal area.  Review of Systems  Constitutional: Negative.   HENT: Negative.   Eyes: Negative.   Respiratory: Negative.   Cardiovascular: Negative.   Gastrointestinal: Positive for anal bleeding and rectal pain.  Endocrine: Negative.   Genitourinary: Negative.   Musculoskeletal: Negative.   Skin: Negative.   Allergic/Immunologic: Negative.   Neurological: Negative.   Hematological: Negative.   Psychiatric/Behavioral: Negative.        Objective:   Physical Exam  Constitutional: She is oriented to person, place, and time. She appears well-developed and well-nourished.  HENT:  Head: Normocephalic and atraumatic.  Eyes: Conjunctivae and EOM are normal. Pupils are equal, round, and reactive to light.  Neck: Normal range of motion. Neck supple.  Cardiovascular: Normal rate, regular rhythm and normal heart sounds.   Pulmonary/Chest: Effort normal and breath sounds normal.  Abdominal: Soft. Bowel sounds are normal.  Genitourinary:  On rectal exam mostly what we see is some irritation of the perirectal skin.  Musculoskeletal: Normal range of motion.  Neurological: She is alert and oriented to person, place, and time.  Skin: Skin is warm and dry.  Psychiatric: She has a normal mood and affect. Her behavior is normal.       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 We will have her followup in about a month with Dr. Maisie Fus our colorectal specialist to see if anything else needs to be done

## 2013-04-16 ENCOUNTER — Other Ambulatory Visit: Payer: Self-pay | Admitting: Obstetrics and Gynecology

## 2013-09-21 IMAGING — MG MM DIGITAL DIAGNOSTIC UNILAT*R*
3 series · 3 of 3 positions shown · non-contrast
Comparison: [DATE] [DATE], [DATE], [DATE] [DATE], [DATE], [DATE] [DATE], [DATE]

CLINICAL DATA: Called back from screening mammogram for possible
mass right breast

DIGITAL DIAGNOSTIC RIGHT MAMMOGRAM WITH CAD AND RIGHT BREAST
ULTRASOUND:

[R MLOID]
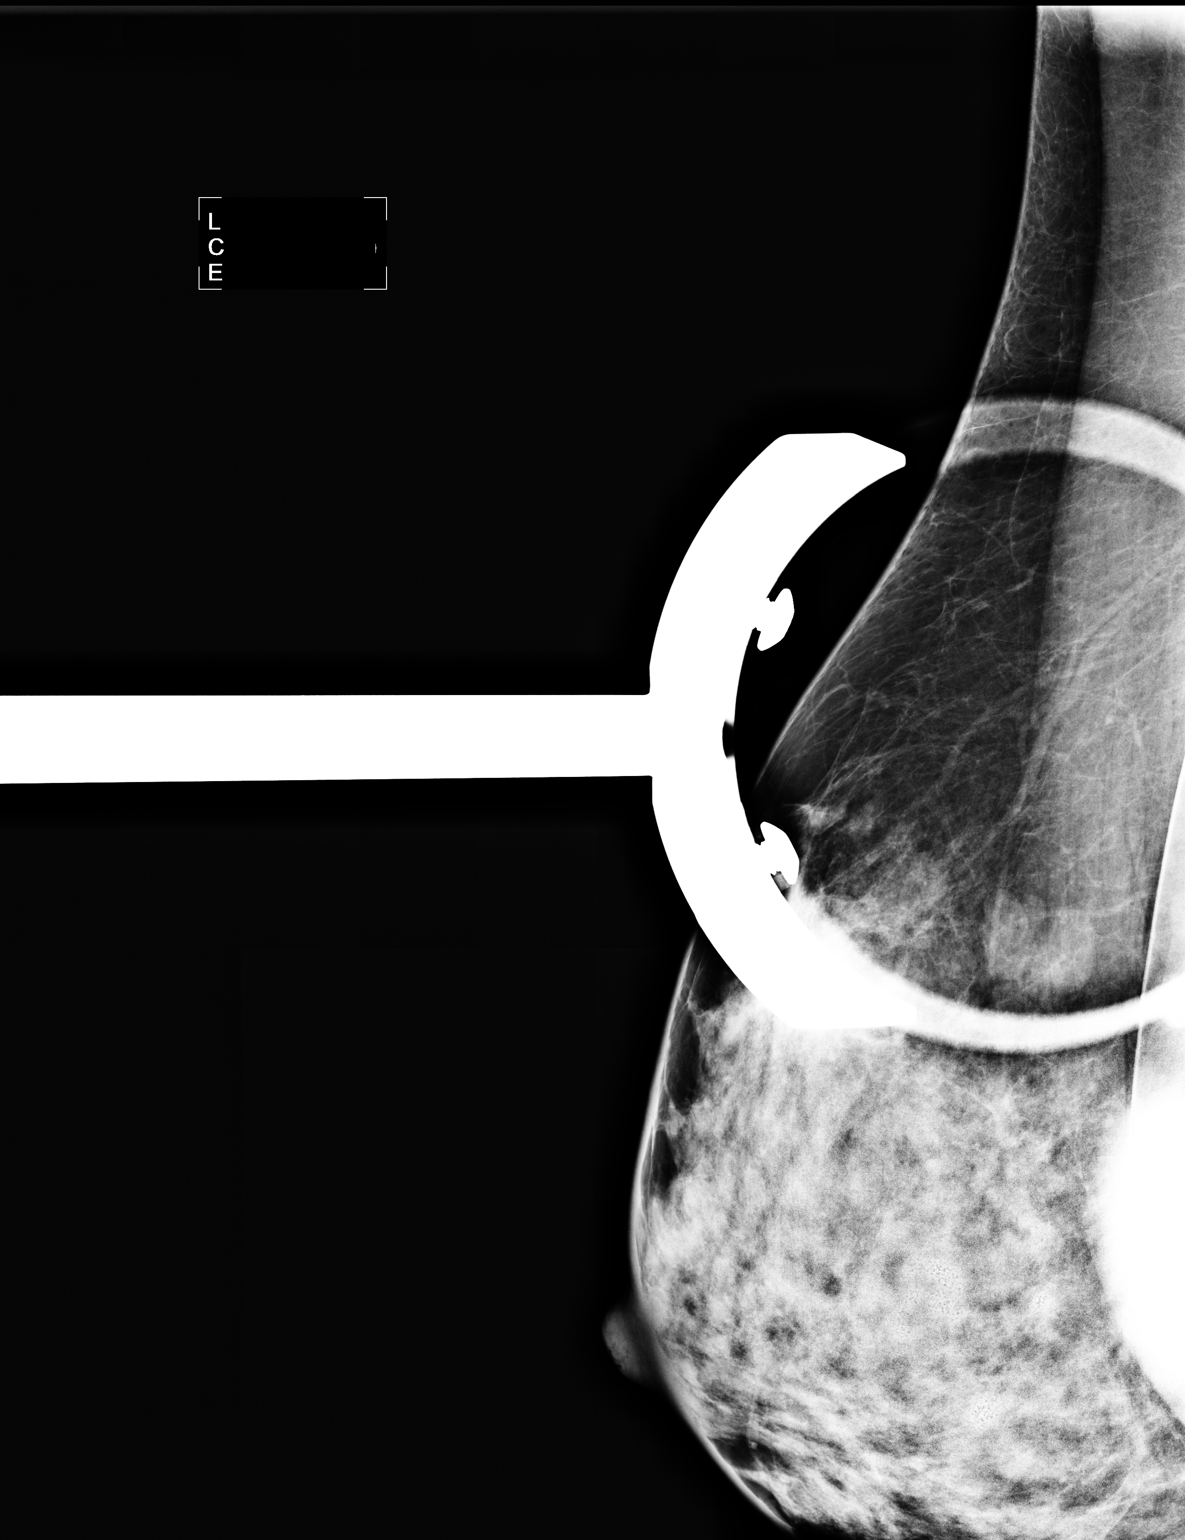

[R MLID (1 of 2)]
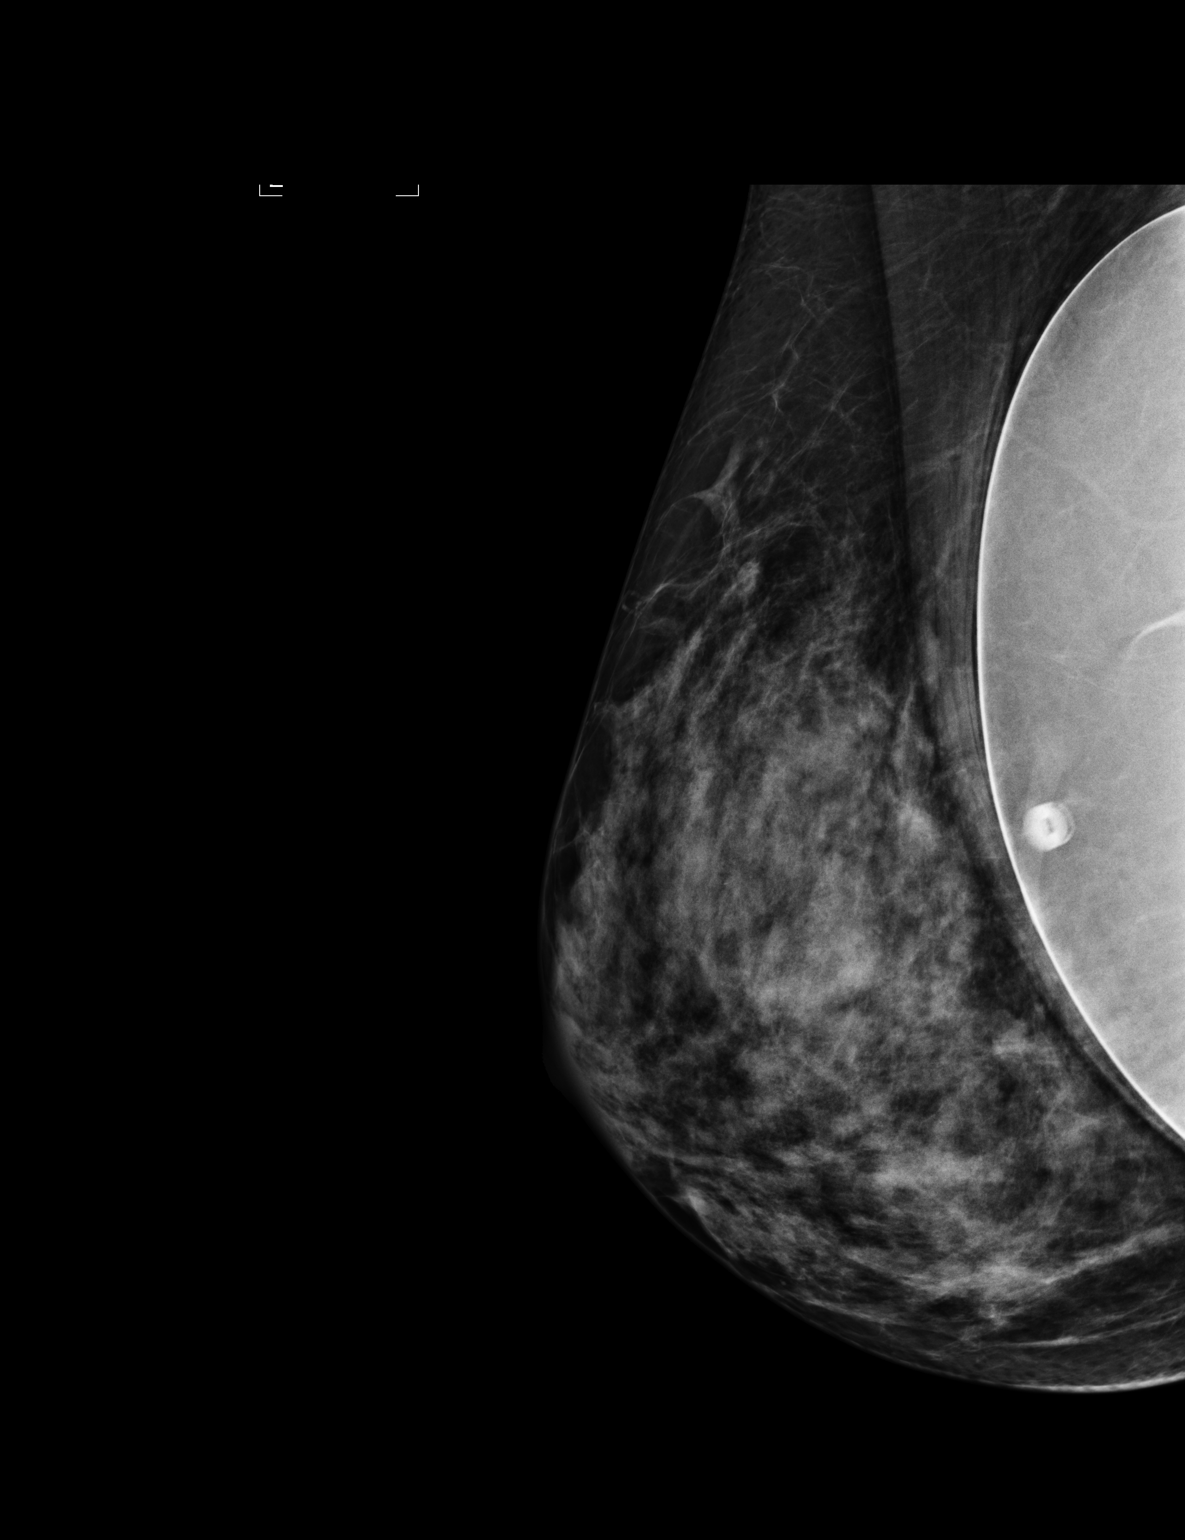

[R MLID (2 of 2)]
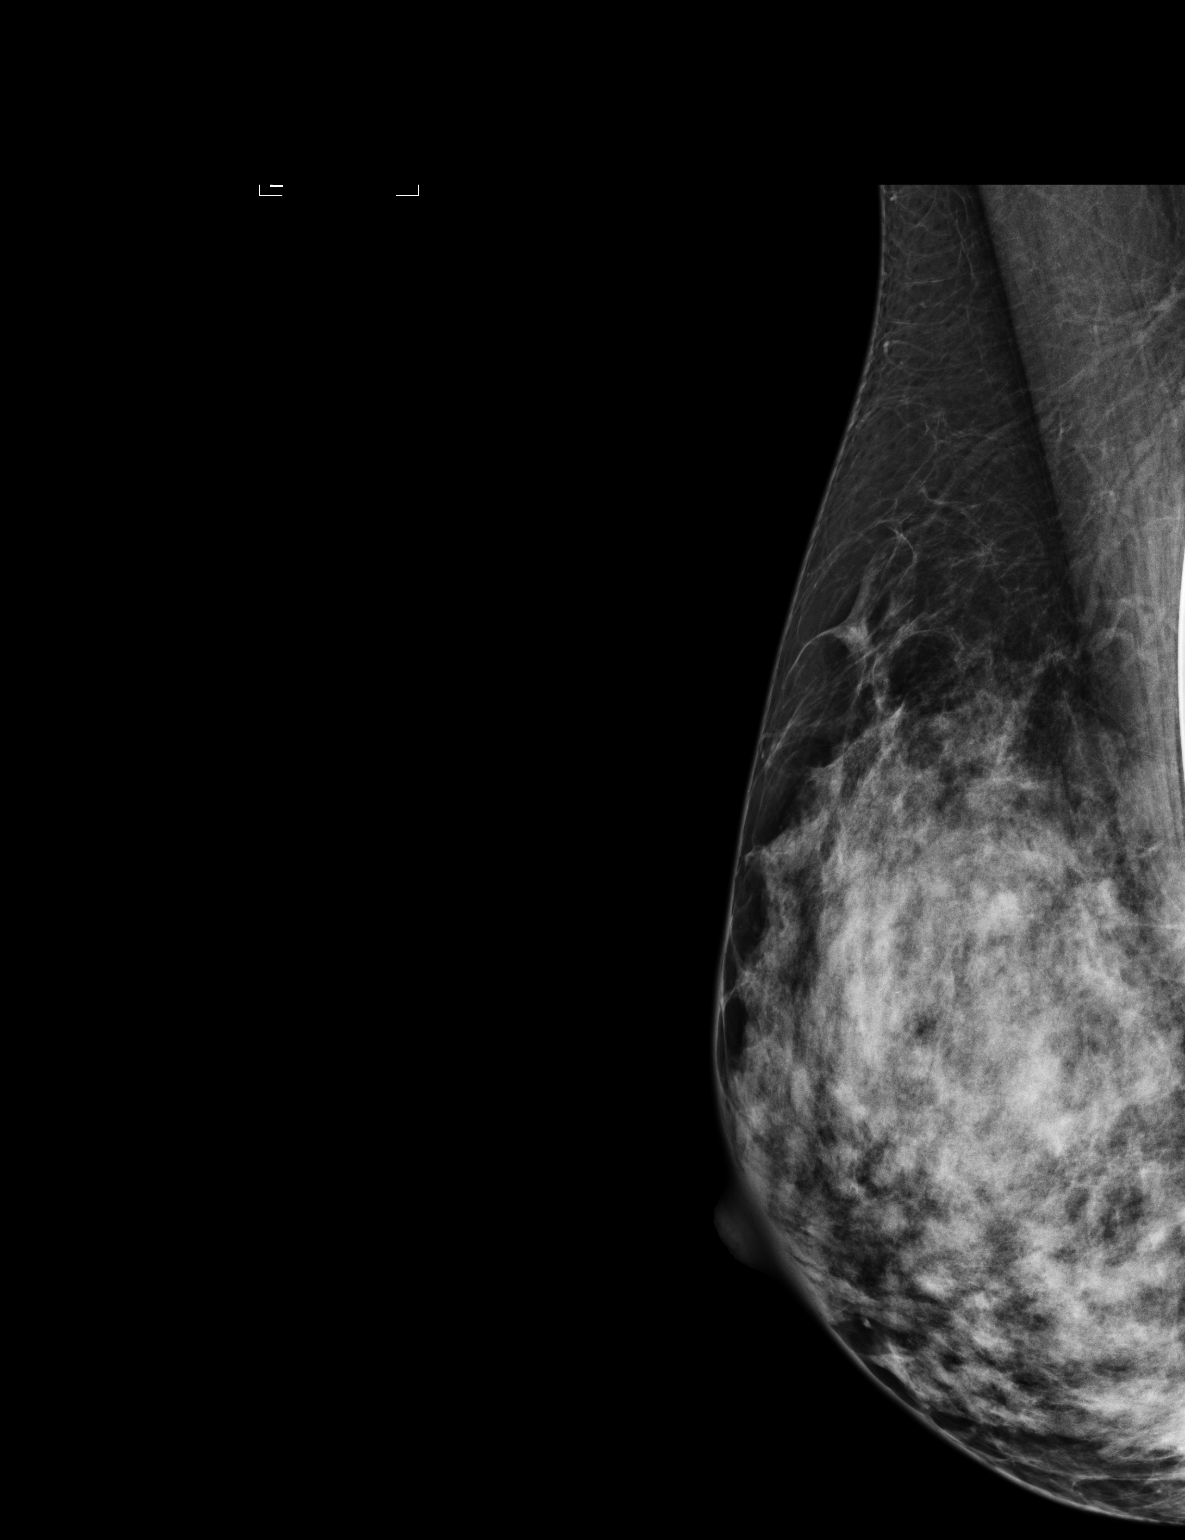

[3 of 3 positions shown; findings below may reference images not displayed]

FINDINGS: Kaki the right lateral views and spot compression
Kaki right MLO views are submitted.  The previously questioned
asymmetries in the posterior upper right breast persists.
Mammographic images were processed with CAD.

Ultrasound is performed, showing two adjacent simple cysts at the
right breast 10 o'clock position 10 cm from nipple, larger
measuring maximum 1.13 cm.  These correlate to the mammographic
finding.
IMPRESSION: Benign findings.

RECOMMENDATION:
Routine screening mammogram back back on schedule.

BI-RADS CATEGORY 2:  Benign finding(s).

## 2013-09-21 IMAGING — US US BREAST*R*
1 series · 5 of 5 positions shown · non-contrast
Comparison: [DATE] [DATE], [DATE], [DATE] [DATE], [DATE], [DATE] [DATE], [DATE]

CLINICAL DATA: Called back from screening mammogram for possible
mass right breast

DIGITAL DIAGNOSTIC RIGHT MAMMOGRAM WITH CAD AND RIGHT BREAST
ULTRASOUND:

[Series 1: us breast*right* · 5 of 5 slices shown]
[im 1/5]
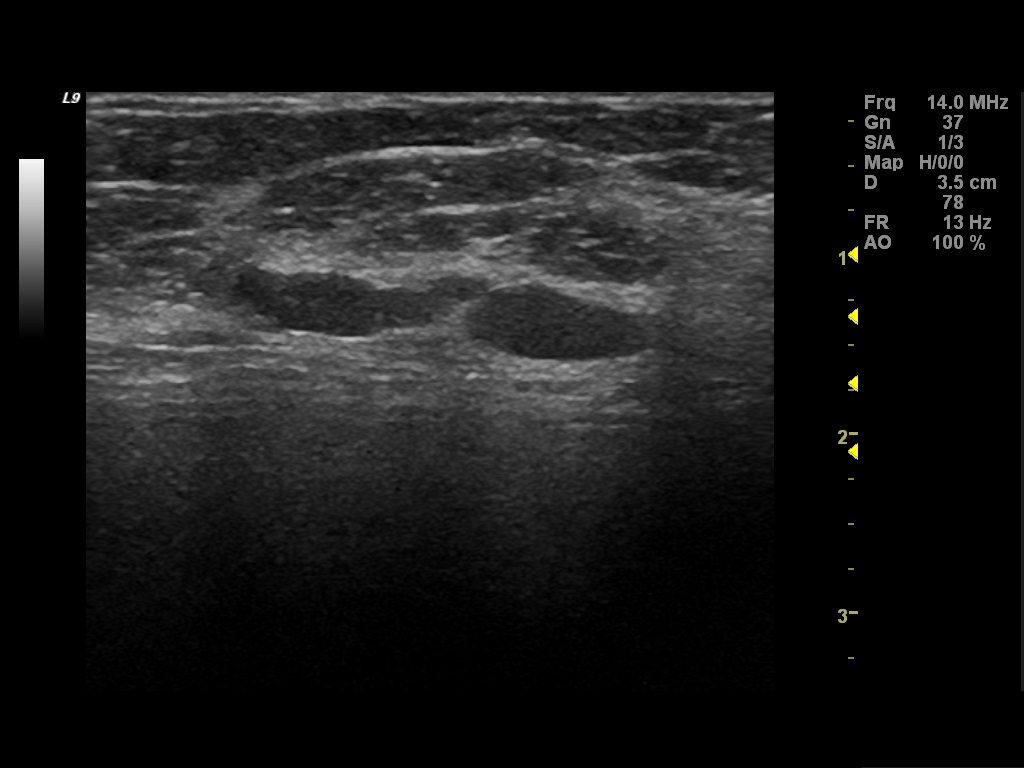
[im 2/5]
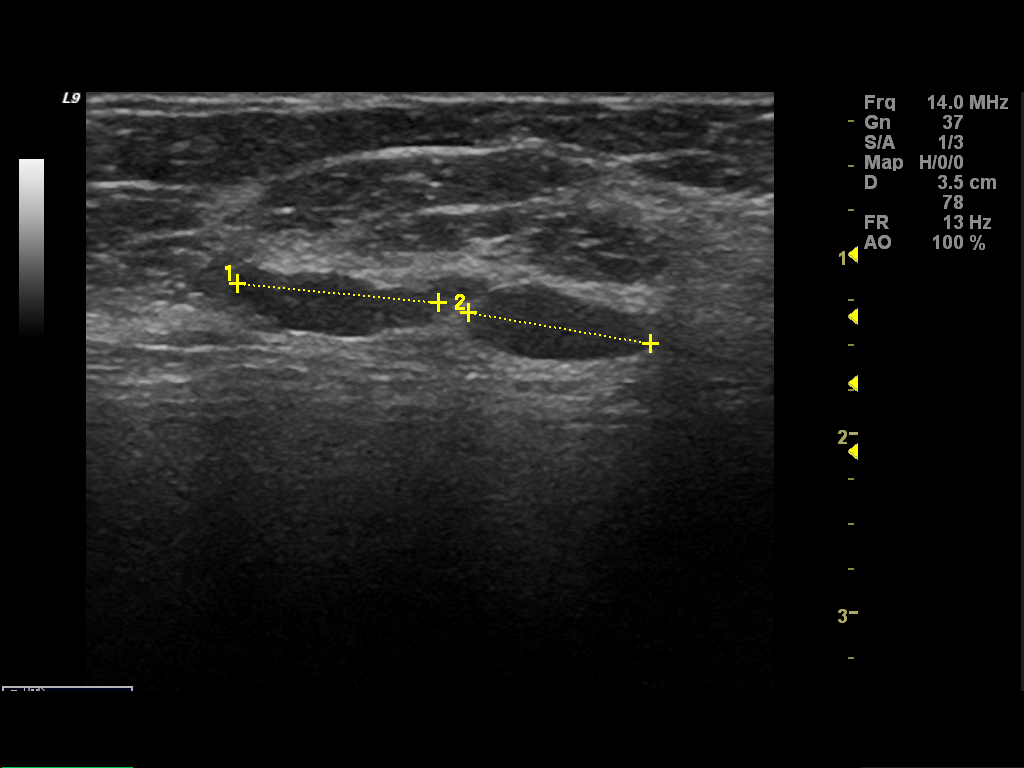
[im 3/5]
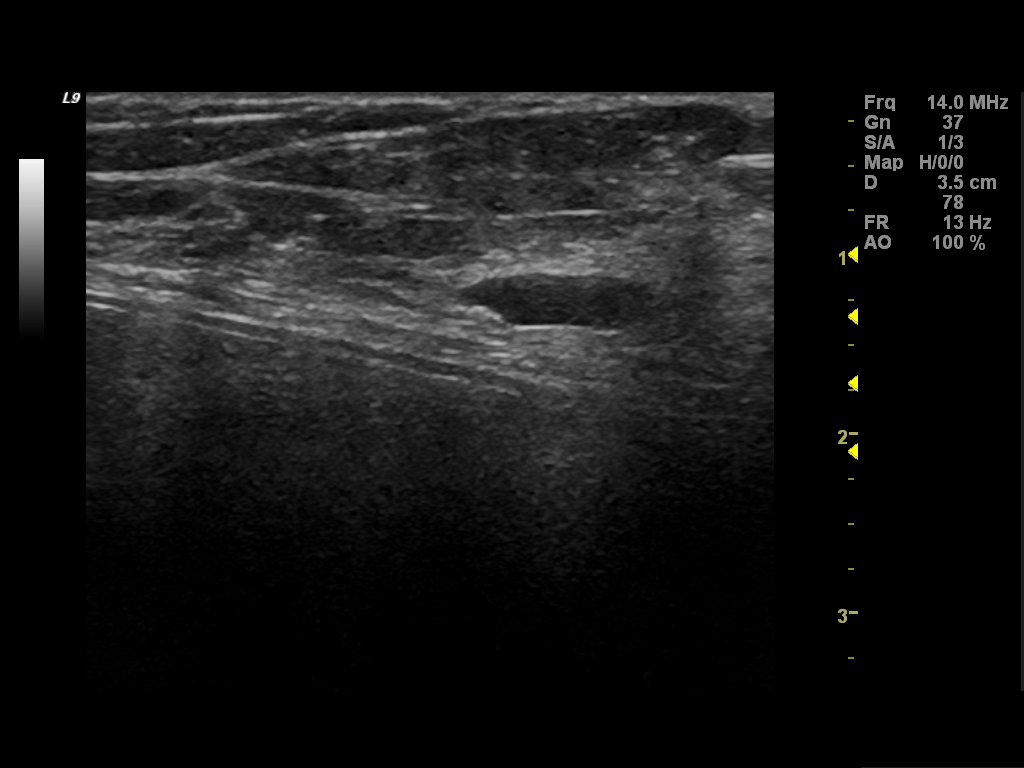
[im 4/5]
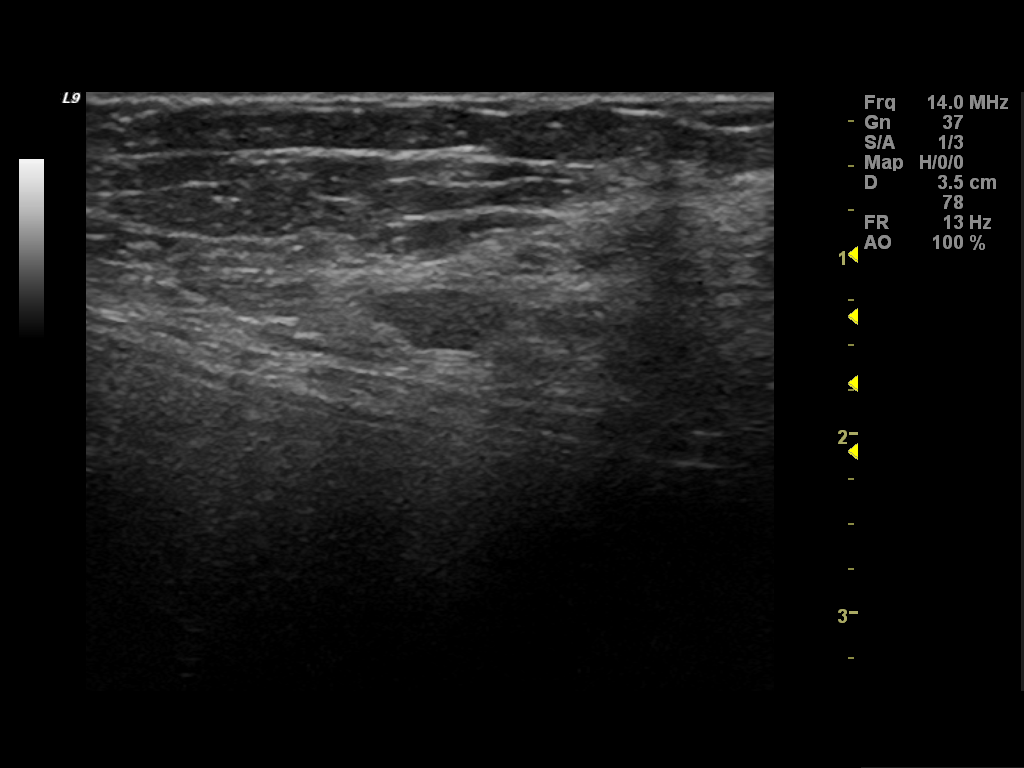
[im 5/5]
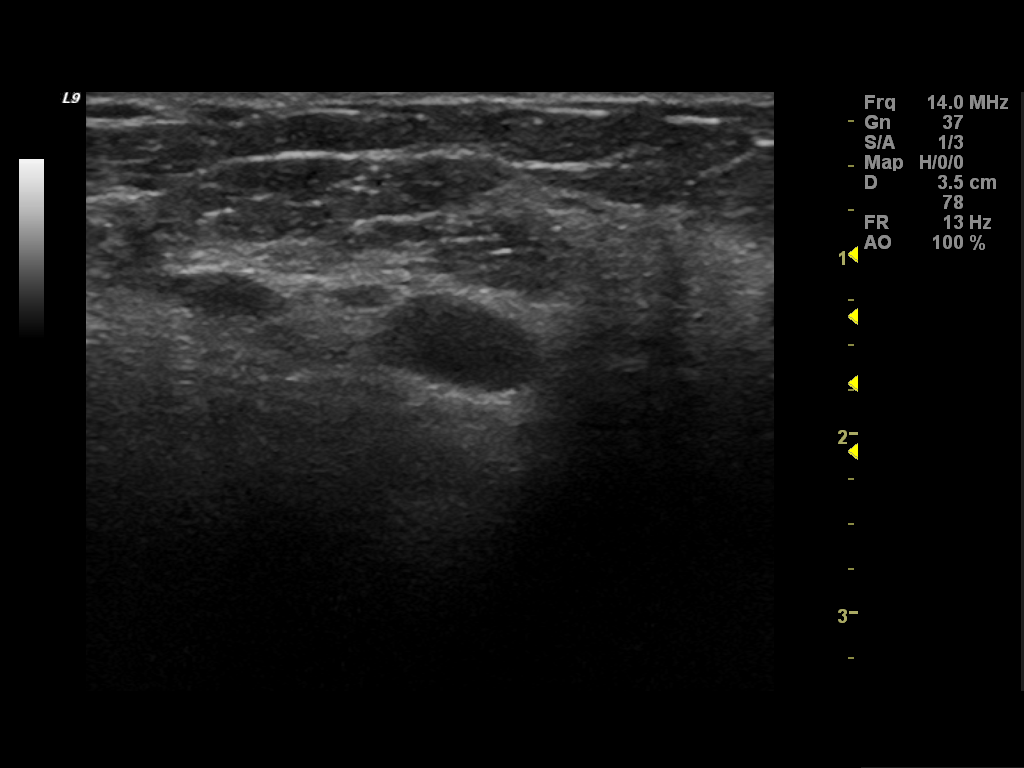

[5 of 5 positions shown; findings below may reference images not displayed]

FINDINGS: Kaki the right lateral views and spot compression
Kaki right MLO views are submitted.  The previously questioned
asymmetries in the posterior upper right breast persists.
Mammographic images were processed with CAD.

Ultrasound is performed, showing two adjacent simple cysts at the
right breast 10 o'clock position 10 cm from nipple, larger
measuring maximum 1.13 cm.  These correlate to the mammographic
finding.
IMPRESSION: Benign findings.

RECOMMENDATION:
Routine screening mammogram back back on schedule.

BI-RADS CATEGORY 2:  Benign finding(s).

## 2014-04-17 ENCOUNTER — Other Ambulatory Visit: Payer: Self-pay | Admitting: Obstetrics and Gynecology

## 2014-04-18 LAB — CYTOLOGY - PAP

## 2014-05-30 ENCOUNTER — Ambulatory Visit (INDEPENDENT_AMBULATORY_CARE_PROVIDER_SITE_OTHER): Payer: BC Managed Care – PPO

## 2014-05-30 ENCOUNTER — Encounter: Payer: Self-pay | Admitting: Podiatry

## 2014-05-30 ENCOUNTER — Ambulatory Visit (INDEPENDENT_AMBULATORY_CARE_PROVIDER_SITE_OTHER): Payer: BC Managed Care – PPO | Admitting: Podiatry

## 2014-05-30 VITALS — BP 115/75 | HR 64 | Resp 16

## 2014-05-30 DIAGNOSIS — M779 Enthesopathy, unspecified: Secondary | ICD-10-CM

## 2014-05-30 DIAGNOSIS — M79672 Pain in left foot: Secondary | ICD-10-CM

## 2014-05-30 MED ORDER — TRIAMCINOLONE ACETONIDE 10 MG/ML IJ SUSP
10.0000 mg | Freq: Once | INTRAMUSCULAR | Status: AC
  Administered 2014-05-30: 10 mg

## 2014-05-30 NOTE — Progress Notes (Signed)
Subjective:     Patient ID: Meghan Gonzales, female   DOB: [birth year], 43 y.o.   MRN: [id number]  HPIpatient presents with painful joint on the left foot stating that she was doing exercises and felt that become sore about a month ago   Review of Systems  All other systems reviewed and are negative.      Objective:   Physical Exam  Constitutional: She is oriented to person, place, and time.  Cardiovascular: Intact distal pulses.   Musculoskeletal: Normal range of motion.  Neurological: She is oriented to person, place, and time.  Skin: Skin is warm.  Nursing note and vitals reviewed. neurovascular status intact with muscle strength adequate and range of motion of the subtalar and midtarsal joint noted patient is found to have discomfort and inflammation second MPJ left and has good range of motion of the underlying metatarsal phalangeal joints. Patient has good digital perfusion and is well oriented 3     Assessment:     Inflammatory capsulitis second MPJ left foot secondary to trauma    Plan:

## 2014-07-25 ENCOUNTER — Ambulatory Visit (INDEPENDENT_AMBULATORY_CARE_PROVIDER_SITE_OTHER): Payer: BC Managed Care – PPO

## 2014-07-25 ENCOUNTER — Ambulatory Visit (INDEPENDENT_AMBULATORY_CARE_PROVIDER_SITE_OTHER): Payer: BC Managed Care – PPO | Admitting: Podiatry

## 2014-07-25 ENCOUNTER — Encounter: Payer: Self-pay | Admitting: Podiatry

## 2014-07-25 VITALS — BP 91/45 | HR 79 | Resp 12

## 2014-07-25 DIAGNOSIS — M779 Enthesopathy, unspecified: Secondary | ICD-10-CM

## 2014-07-25 DIAGNOSIS — M205X2 Other deformities of toe(s) (acquired), left foot: Secondary | ICD-10-CM

## 2014-07-25 MED ORDER — TRIAMCINOLONE ACETONIDE 10 MG/ML IJ SUSP
10.0000 mg | Freq: Once | INTRAMUSCULAR | Status: AC
  Administered 2014-07-25: 10 mg

## 2014-07-25 NOTE — Progress Notes (Signed)
Subjective:     Patient ID: Meghan Gonzales, female   DOB: 02-19-1971, 43 y.o.   MRN: [id number]  HPI patient presents with pain around the first metatarsal head left stating that the other area we worked on is doing very well   Review of Systems     Objective:   Physical Exam Neurovascular status intact muscle strength adequate range of motion within normal limits. 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 May require other treatment in future

## 2015-01-07 ENCOUNTER — Encounter: Payer: Self-pay | Admitting: Sports Medicine

## 2015-01-28 ENCOUNTER — Encounter: Payer: Self-pay | Admitting: Sports Medicine

## 2015-01-28 ENCOUNTER — Ambulatory Visit (INDEPENDENT_AMBULATORY_CARE_PROVIDER_SITE_OTHER): Payer: BLUE CROSS/BLUE SHIELD | Admitting: Sports Medicine

## 2015-01-28 VITALS — BP 113/69 | Ht 71.0 in | Wt 155.0 lb

## 2015-01-28 DIAGNOSIS — M25579 Pain in unspecified ankle and joints of unspecified foot: Secondary | ICD-10-CM | POA: Diagnosis not present

## 2015-01-28 NOTE — Progress Notes (Signed)
Patient ID: Meghan Gonzales, female   DOB: [birth year], 44 y.o.   MRN: [id number]  Patient comes in today at the request of Dr. Noemi Chapel for custom orthotics. A recent MRI of her left foot showed mild intermetatarsal bursitis between the first and second metatarsals. Patient localizes her pain to the first MTP joint primarily on the plantar aspect of the foot. Symptoms of been present now for over a year.  Custom orthotics were constructed for the patient today. I would like to try a first ray post to help cushion the first MTP joint. I think this may work better than a metatarsal pad.  She will return to Dr. Noemi Chapel as scheduled and will follow-up with me when necessary.  Please note that 45 minutes were spent with this patient with greater than 50% of the time spent in face-to-face consultation discussing orthotic instruction, instruction, and fitting. Patient found orthotics comfortable prior to leaving the office.  Patient was fitted for a : standard, cushioned, semi-rigid orthotic. The orthotic was heated and afterward the patient stood on the orthotic blank positioned on the orthotic stand. The patient was positioned in subtalar neutral position and 10 degrees of ankle dorsiflexion in a weight bearing stance. After completion of molding, a stable base was applied to the orthotic blank. The blank was ground to a stable position for weight bearing. Size:8 Base: blue EVA Posting:  Left first ray post Additional orthotic padding: none

## 2016-03-17 ENCOUNTER — Ambulatory Visit (INDEPENDENT_AMBULATORY_CARE_PROVIDER_SITE_OTHER): Payer: BLUE CROSS/BLUE SHIELD | Admitting: Student

## 2016-03-17 ENCOUNTER — Encounter: Payer: Self-pay | Admitting: Student

## 2016-03-17 DIAGNOSIS — S76312A Strain of muscle, fascia and tendon of the posterior muscle group at thigh level, left thigh, initial encounter: Secondary | ICD-10-CM | POA: Diagnosis not present

## 2016-03-17 NOTE — Assessment & Plan Note (Signed)
Due to significant ecchymosis of the area at time of injury, did do ultrasound of her left hamstring which did not show a tear. She may have had a microtear to the hamstring which caused the bruising. This is not behaving as a typical hamstring strain. Recommended light exercising and keeping away from squats at the time being. She can gradually return to exercising as her pain allows. However she is really having minimal pain. Follow-up as needed.

## 2016-03-17 NOTE — Progress Notes (Addendum)
  Meghan Gonzales - 45 y.o. female MRN [id number]  Date of birth: [birth year]  SUBJECTIVE:  Including CC & ROS.  CC: Left hamstring pain Meghan Gonzales presents with left hamstring pain that began a couple weeks ago. She notes that it is mostly resolved and only notices it when she really exerts herself with her hamstring. She was doing squats with a kettle bell with her trainer and did not actually notice any pain at the time. Later on she started feeling some mild amount of pain and the middle of her hamstrings and then noticed a significant amount of ecchymosis to the area. Since that time she has not noticed much pain. She has avoided squats. She has been still exercising with cardiovascular exercises.   ROS: No unexpected weight loss, fever, chills, swelling, instability, muscle pain, numbness/tingling, redness, otherwise see HPI   PMHx - Updated and reviewed.  Contributory factors include: Negative PSHx - Updated and reviewed.  Contributory factors include:  Negative FHx - Updated and reviewed.  Contributory factors include:  Negative Social Hx - Updated and reviewed. Contributory factors include: Negative Medications - reviewed   DATA REVIEWED: None  PHYSICAL EXAM:  VS: BP:128/80  HR: bpm  TEMP: ( )  RESP:   HT:5\' 11"  (180.3 cm)   WT:170 lb (77.1 kg)  BMI:23.8 PHYSICAL EXAM: Gen: NAD, alert, cooperative with exam, well-appearing HEENT: clear conjunctiva,  CV:  no edema, capillary refill brisk, normal rate Resp: non-labored Skin: no rashes, normal turgor, small ecchymosis present on the lateral aspect of left thigh Neuro: no gross deficits.  Psych:  alert and oriented   evaluation of bilateral hamstrings reveal no ecchymosis of the  Hamstrings bilaterally.  She has full muscle strength of bilateral hamstrings.  Ultrasound of the left hamstring did not reveal any hypoechoic changes and showed fibrillary structures in all bellies of the hamstring tendons- ST, SM. These are followed up to  the ischium.     ASSESSMENT & PLAN:   Left hamstring muscle strain Due to significant ecchymosis of the area at time of injury, did do ultrasound of her left hamstring which did not show a tear. She may have had a microtear to the hamstring which caused the bruising. This is not behaving as a typical hamstring strain. Recommended light exercising and keeping away from squats at the time being. She can gradually return to exercising as her pain allows. However she is really having minimal pain. Follow-up as needed.

## 2017-04-13 ENCOUNTER — Encounter: Payer: Self-pay | Admitting: Gastroenterology

## 2018-08-25 ENCOUNTER — Encounter: Payer: Self-pay | Admitting: Physician Assistant

## 2018-08-31 ENCOUNTER — Ambulatory Visit: Payer: BLUE CROSS/BLUE SHIELD | Admitting: Physician Assistant

## 2018-09-12 ENCOUNTER — Ambulatory Visit: Payer: BLUE CROSS/BLUE SHIELD | Admitting: Physician Assistant

## 2018-10-17 ENCOUNTER — Other Ambulatory Visit: Payer: Self-pay

## 2018-10-17 ENCOUNTER — Telehealth (INDEPENDENT_AMBULATORY_CARE_PROVIDER_SITE_OTHER): Payer: 59 | Admitting: Physician Assistant

## 2018-10-17 DIAGNOSIS — K5900 Constipation, unspecified: Secondary | ICD-10-CM | POA: Diagnosis not present

## 2018-10-17 DIAGNOSIS — K625 Hemorrhage of anus and rectum: Secondary | ICD-10-CM

## 2018-10-17 NOTE — Progress Notes (Signed)
TELEMEDICINE VISIT  Chief Complaint: Rectal bleeding, constipation  HPI:    Meghan Gonzales is a 48 year old female, known to Dr. Fuller Plan, who was referred to me by Gaynelle Arabian, MD for a complaint of rectal bleeding and constipation.      03/28/2012 colonoscopy with a sessile polyp in the transverse colon x2, sessile polyp in the descending colon and small internal hemorrhoids.  Pathology showed benign polypoid colonic mucosa and repeat was recommended in 10 years.    Today, the patient tells me that she has had "random rectal bleeding" occasionally off and on for forever.  She recalls a history of anal fissures back in her 26s for which she eventually had surgery.  At that time she was told these were "the worst they had ever seen".  Over the past year she has had 2-3 times when she feels a pressure in her rectum "just like the fissure" and at that point will get off of her feet and heat helps, but this typically subsides.  Currently not having any rectal pain or pressure but is continuing with some bright red blood on the toilet paper off and on.  She is chronically on fiber and probiotics due to a history of constipation which seems to keep her regular.  Due to length of the symptoms her PCP recommended she have a colonoscopy for further evaluation.    Denies weight loss or change in bowel habits, fever, chills, abdominal pain or symptoms that awaken her from sleep.  Past Medical History:  Diagnosis Date  . Anal fissure   . Constipation   . Hyperlipidemia     Past Surgical History:  Procedure Laterality Date  . ABDOMINAL HYSTERECTOMY  2011  . ANAL FISSURECTOMY    . CESAREAN SECTION  10/10/1999    Current Outpatient Medications  Medication Sig Dispense Refill  . cholecalciferol (VITAMIN D) 1000 units tablet Take 5,000 Units by mouth daily.    Marland Kitchen co-enzyme Q-10 30 MG capsule Take 30 mg by mouth daily.    Marland Kitchen estradiol (VIVELLE-DOT) 0.05 MG/24HR patch Place 1 patch onto the skin 2 (two)  times a week.    . fish oil-omega-3 fatty acids 1000 MG capsule Take 2 g by mouth 3 (three) times daily.    . Multiple Vitamin (MULTIVITAMIN) tablet Take 1 tablet by mouth daily.    . TURMERIC PO Take by mouth.    . valACYclovir (VALTREX) 1000 MG tablet     . nitrofurantoin, macrocrystal-monohydrate, (MACROBID) 100 MG capsule TAKE 1 CAPSULE TWICE DAILY UNTIL GONE.  0  . sulfamethoxazole-trimethoprim (BACTRIM DS,SEPTRA DS) 800-160 MG per tablet Take 1 tablet by mouth 2 (two) times daily.  0   No current facility-administered medications for this visit.     Allergies as of 10/17/2018 - Review Complete 10/17/2018  Allergen Reaction Noted  . Hydrocodone Itching 03/28/2012    Family History  Problem Relation Age of Onset  . Diabetes Father   . Colon polyps Mother     Social History   Socioeconomic History  . Marital status: Married    Spouse name: Not on file  . Number of children: Not on file  . Years of education: Not on file  . Highest education level: Not on file  Occupational History  . Not on file  Social Needs  . Financial resource strain: Not on file  . Food insecurity:    Worry: Not on file    Inability: Not on file  . Transportation needs:  Medical: Not on file    Non-medical: Not on file  Tobacco Use  . Smoking status: Never Smoker  . Smokeless tobacco: Never Used  Substance and Sexual Activity  . Alcohol use: Yes    Comment: glass of wine per day  . Drug use: No  . Sexual activity: Not on file  Lifestyle  . Physical activity:    Days per week: Not on file    Minutes per session: Not on file  . Stress: Not on file  Relationships  . Social connections:    Talks on phone: Not on file    Gets together: Not on file    Attends religious service: Not on file    Active member of club or organization: Not on file    Attends meetings of clubs or organizations: Not on file    Relationship status: Not on file  . Intimate partner violence:    Fear of current  or ex partner: Not on file    Emotionally abused: Not on file    Physically abused: Not on file    Forced sexual activity: Not on file  Other Topics Concern  . Not on file  Social History Narrative  . Not on file    Review of Systems:    Constitutional: No weight loss, fever or chills Skin: No rash  Cardiovascular: No chest pain Respiratory: No SOB  Gastrointestinal: See HPI and otherwise negative Genitourinary: No dysuria Neurological: No headache Musculoskeletal: No new muscle or joint pain Hematologic: No bruising Psychiatric: No history of depression or anxiety   Physical Exam: TELEMEDICINE VISIT-NO PHYSICAL EXAM   No recent labs or imaging.  Assessment: 1.  Rectal bleeding: History of fissures in the past, no current rectal pain, off and on bright red blood on the toilet paper for years, some increase over the past year, hemorrhoids on last colonoscopy 2013 as well as benign polyps; consider most likely hemorrhoids versus fissure versus other 2.  Constipation: Chronic for the patient, better with fiber and probiotics  Plan: 1.  Discussed options with the patient, including coming in for an office visit and anoscopy for further evaluation of rectal bleeding.  At this point patient tells me it has been going on so long that she would feel more comfortable with a colonoscopy.  Discussed this briefly and she would like to call her insurance to see how much this would cost for her.  Tells me she will call us back if she decides to proceed. 2.  If patient wants a colonoscopy, would recommend a colonoscopy with Dr. Fuller Plan in the [facility].  Patient would likely need to come in for a nurse pre-visit as we did not Gonzales through her instructions today.  This would be a more emergent procedure given history of rectal bleeding and would not be screening or surveillance. 3.  Patient to continue fiber and probiotic 4.  Patient will call and let us know what she would like [place] to do next, follow-up  pending that.  Service today was provided via telemedicine.  Patient did consent to service and is aware of charges that may be accrued.  Patient was located at home.  Provider located in the office.  Patient was referred to our clinic by Dr. Marisue Humble.  No other persons were participating in the telemedicine service.  Time spent on call: 11 minutes  Ellouise Newer, PA-C [facility] 10/17/2018, 11:28 AM  Cc: Gaynelle Arabian, MD

## 2018-10-17 NOTE — Patient Instructions (Addendum)
Please call us when you have decided about colonoscopy.  If you do decide to proceed with colonoscopy, you will need a nurse pre-visit at our clinic which can be scheduled for you prior to diagnostic colonoscopy with Dr. Fuller Plan in the [facility].  Continue fiber and probiotic.  If you decide not to proceed with colonoscopy, would recommend OV with anoscopy in the near future for further eval.  Thank you for allowing me to assist with your care today, Meghan Newer, PA-C

## 2018-10-17 NOTE — Progress Notes (Signed)
Reviewed and agree with management plan. Colonoscopy indication is urgent and should be scheduled if patient is agreeable to proceed.   Pricilla Riffle. Fuller Plan, MD [facility]

## 2019-05-14 ENCOUNTER — Other Ambulatory Visit: Payer: Self-pay

## 2019-05-14 DIAGNOSIS — Z20822 Contact with and (suspected) exposure to covid-19: Secondary | ICD-10-CM

## 2019-05-16 LAB — NOVEL CORONAVIRUS, NAA: SARS-CoV-2, NAA: NOT DETECTED

## 2019-07-17 ENCOUNTER — Other Ambulatory Visit: Payer: Self-pay | Admitting: Obstetrics and Gynecology

## 2019-07-17 DIAGNOSIS — R928 Other abnormal and inconclusive findings on diagnostic imaging of breast: Secondary | ICD-10-CM

## 2019-07-23 ENCOUNTER — Ambulatory Visit: Payer: No Typology Code available for payment source | Attending: Internal Medicine

## 2019-07-23 DIAGNOSIS — Z20822 Contact with and (suspected) exposure to covid-19: Secondary | ICD-10-CM

## 2019-07-25 LAB — NOVEL CORONAVIRUS, NAA: SARS-CoV-2, NAA: DETECTED — AB

## 2019-07-26 ENCOUNTER — Other Ambulatory Visit: Payer: 59

## 2019-08-08 ENCOUNTER — Other Ambulatory Visit: Payer: No Typology Code available for payment source

## 2019-08-17 ENCOUNTER — Ambulatory Visit
Admission: RE | Admit: 2019-08-17 | Discharge: 2019-08-17 | Disposition: A | Discharge status: Home / Self Care | Payer: No Typology Code available for payment source | Source: Ambulatory Visit | Attending: Obstetrics and Gynecology | Admitting: Obstetrics and Gynecology

## 2019-08-17 ENCOUNTER — Other Ambulatory Visit: Payer: Self-pay

## 2019-08-17 DIAGNOSIS — R928 Other abnormal and inconclusive findings on diagnostic imaging of breast: Secondary | ICD-10-CM

## 2019-11-05 ENCOUNTER — Ambulatory Visit: Payer: BLUE CROSS/BLUE SHIELD | Attending: Internal Medicine

## 2019-11-05 DIAGNOSIS — Z20822 Contact with and (suspected) exposure to covid-19: Secondary | ICD-10-CM

## 2019-11-06 LAB — SARS-COV-2, NAA 2 DAY TAT

## 2019-11-06 LAB — NOVEL CORONAVIRUS, NAA: SARS-CoV-2, NAA: NOT DETECTED

## 2020-01-22 ENCOUNTER — Ambulatory Visit
Admission: RE | Admit: 2020-01-22 | Discharge: 2020-01-22 | Disposition: A | Discharge status: Home / Self Care | Payer: No Typology Code available for payment source | Source: Ambulatory Visit | Attending: Emergency Medicine | Admitting: Emergency Medicine

## 2020-01-22 ENCOUNTER — Other Ambulatory Visit: Payer: Self-pay

## 2020-01-22 VITALS — BP 123/79 | HR 86 | Temp 98.1°F | Resp 16

## 2020-01-22 DIAGNOSIS — R509 Fever, unspecified: Secondary | ICD-10-CM

## 2020-01-22 MED ORDER — ALBUTEROL SULFATE HFA 108 (90 BASE) MCG/ACT IN AERS: 2.0000 | INHALATION_SPRAY | RESPIRATORY_TRACT | 0 refills | Status: DC | PRN

## 2020-01-22 MED ORDER — CETIRIZINE HCL 10 MG PO TABS: 10.0000 mg | ORAL_TABLET | Freq: Every day | ORAL | 0 refills | Status: DC

## 2020-01-22 MED ORDER — PROMETHAZINE-DM 6.25-15 MG/5ML PO SYRP: 5.0000 mL | ORAL_SOLUTION | Freq: Every evening | ORAL | 0 refills | Status: DC | PRN

## 2020-01-22 MED ORDER — FLUTICASONE PROPIONATE 50 MCG/ACT NA SUSP: 1.0000 | Freq: Every day | NASAL | 0 refills | Status: AC

## 2020-01-22 MED ORDER — BENZONATATE 100 MG PO CAPS: 100.0000 mg | ORAL_CAPSULE | Freq: Three times a day (TID) | ORAL | 0 refills | Status: DC

## 2020-01-22 NOTE — Discharge Instructions (Signed)
Your COVID test is pending - it is important to quarantine / isolate at home until your results are back. °If you test positive and would like further evaluation for persistent or worsening symptoms, you may schedule an E-visit or virtual (video) visit throughout the [facility] My[facility] app or website. ° °PLEASE NOTE: If you develop severe chest pain or shortness of breath please go to the ER or call 9-1-1 for further evaluation --> DO NOT schedule electronic or virtual visits for this. °Please call our office for further guidance / recommendations as needed. ° °For information about the Covid vaccine, please visit [email or web address] °

## 2020-01-22 NOTE — ED Triage Notes (Addendum)
Pt c/o fever, sore throat and headache and right ear pressure since Friday. Had Octa in December, fully COVID vaccinated. Around nephew recently diagnosed with croup. Saw teledoc yesterday and was told she has a virus, requesting COVID testing due to upcoming travel plans

## 2020-01-22 NOTE — ED Provider Notes (Signed)
[facility]    CSN: [id number] Arrival date & time: 01/22/20  1106      History   Chief Complaint Chief Complaint  Patient presents with  . Fever    HPI Meghan Gonzales is a 49 y.o. female presenting for URI symptoms since Friday. Endorsing subjective fever, sore throat, generalized headache, ear pressure. States that she had Covid in December 2021, and had G and J vaccine 3 weeks ago. States her nephew recently was diagnosed with croup. Patient denying productive cough, difficulty breathing, posttussive emesis. States she saw a teledoc yesterday and was told she had a virus, though did not receive any medications. Requesting Covid test today due to upcoming international travel plans.   Past Medical History:  Diagnosis Date  . Anal fissure   . Constipation   . Hyperlipidemia     Patient Active Problem List   Diagnosis Date Noted  . Left hamstring muscle strain 03/17/2016  . Pruritus ani 08/17/2012  . Rectal bleeding 02/21/2012  . Family history of polyps in the colon 02/21/2012  . Anal fissure 09/21/2011    Past Surgical History:  Procedure Laterality Date  . ABDOMINAL HYSTERECTOMY  2011  . ANAL FISSURECTOMY    . CESAREAN SECTION  10/10/1999    OB History   No obstetric history on file.      Home Medications    Prior to Admission medications   Medication Sig Start Date End Date Taking? Authorizing Provider  albuterol (VENTOLIN HFA) 108 (90 Base) MCG/ACT inhaler Inhale 2 puffs into the lungs every 4 (four) hours as needed for wheezing or shortness of breath. 01/22/20   Hall-Potvin, [place], PA-C  benzonatate (TESSALON) 100 MG capsule Take 1 capsule (100 mg total) by mouth every 8 (eight) hours. 01/22/20   Hall-Potvin, [place], PA-C  cetirizine (ZYRTEC ALLERGY) 10 MG tablet Take 1 tablet (10 mg total) by mouth daily. 01/22/20   Hall-Potvin, [place], PA-C  cholecalciferol (VITAMIN D) 1000 units tablet Take 5,000 Units by mouth daily.    [provider]  co-enzyme Q-10 30 MG capsule Take 30 mg by mouth daily.    [provider]  fish oil-omega-3 fatty acids 1000 MG capsule Take 2 g by mouth 3 (three) times daily.    [provider]  fluticasone (FLONASE) 50 MCG/ACT nasal spray Place 1 spray into both nostrils daily. 01/22/20   Hall-Potvin, [place], PA-C  Multiple Vitamin (MULTIVITAMIN) tablet Take 1 tablet by mouth daily.    [provider]  promethazine-dextromethorphan (PROMETHAZINE-DM) 6.25-15 MG/5ML syrup Take 5 mLs by mouth at bedtime as needed for cough. 01/22/20   Hall-Potvin, [place], PA-C  TURMERIC PO Take by mouth.    [provider]    Family History Family History  Problem Relation Age of Onset  . Diabetes Father   . Colon polyps Mother     Social History Social History   Tobacco Use  . Smoking status: Never Smoker  . Smokeless tobacco: Never Used  Substance Use Topics  . Alcohol use: Yes    Comment: glass of wine per day  . Drug use: No     Allergies   Hydrocodone   Review of Systems As per HPI   Physical Exam Triage Vital Signs ED Triage Vitals  Enc Vitals Group     BP 01/22/20 1117 123/79     Pulse Rate 01/22/20 1117 86     Resp 01/22/20 1117 16     Temp 01/22/20 1119 98.1 F (36.7 C)  Temp src --      SpO2 01/22/20 1117 96 %     Weight --      Height --      Head Circumference --      Peak Flow --      Pain Score 01/22/20 1120 0     Pain Loc --      Pain Edu? --      Excl. in [place]? --    No data found.  Updated Vital Signs BP 123/79   Pulse 86   Temp 98.1 F (36.7 C)   Resp 16   SpO2 96%   Visual Acuity Right Eye Distance:   Left Eye Distance:   Bilateral Distance:    Right Eye Near:   Left Eye Near:    Bilateral Near:     Physical Exam Constitutional:      General: She is not in acute distress.    Appearance: She is obese. She is not ill-appearing or diaphoretic.  HENT:     Head: Normocephalic and atraumatic.      Mouth/Throat:     Mouth: Mucous membranes are moist.     Pharynx: Oropharynx is clear. No oropharyngeal exudate or posterior oropharyngeal erythema.  Eyes:     General: No scleral icterus.    Conjunctiva/sclera: Conjunctivae normal.     Pupils: Pupils are equal, round, and reactive to light.  Neck:     Comments: Trachea midline, negative JVD Cardiovascular:     Rate and Rhythm: Normal rate and regular rhythm.     Heart sounds: No murmur heard.  No gallop.   Pulmonary:     Effort: Pulmonary effort is normal. No respiratory distress.     Breath sounds: No wheezing, rhonchi or rales.  Musculoskeletal:     Cervical back: Neck supple. No tenderness.  Lymphadenopathy:     Cervical: No cervical adenopathy.  Skin:    Capillary Refill: Capillary refill takes less than 2 seconds.     Coloration: Skin is not jaundiced or pale.     Findings: No rash.  Neurological:     General: No focal deficit present.     Mental Status: She is alert and oriented to person, place, and time.      UC Treatments / Results  Labs (all labs ordered are listed, but only abnormal results are displayed) Labs Reviewed  NOVEL CORONAVIRUS, NAA    EKG   Radiology No results found.  Procedures Procedures (including critical care time)  Medications Ordered in UC Medications - No data to display  Initial Impression / Assessment and Plan / UC Course  I have reviewed the triage vital signs and the nursing notes.  Pertinent labs & imaging results that were available during my care of the patient were reviewed by me and considered in my medical decision making (see chart for details).     Patient afebrile, nontoxic, with SpO2 96%.  Covid PCR pending.  Patient to quarantine until results are back.  We will treat supportively as outlined below.  Return precautions discussed, patient verbalized understanding and is agreeable to plan. Final Clinical Impressions(s) / UC Diagnoses   Final diagnoses:  Fever,  unspecified     Discharge Instructions     Your COVID test is pending - it is important to quarantine / isolate at home until your results are back. If you test positive and would like further evaluation for persistent or worsening symptoms, you may schedule an E-visit or virtual (video) visit  throughout the [facility] app or website.  PLEASE NOTE: If you develop severe chest pain or shortness of breath please go to the ER or call 9-1-1 for further evaluation --> DO NOT schedule electronic or virtual visits for this. Please call our office for further guidance / recommendations as needed.  For information about the Covid vaccine, please visit [email or web address]    ED Prescriptions    Medication Sig Dispense Auth. Provider   fluticasone (FLONASE) 50 MCG/ACT nasal spray Place 1 spray into both nostrils daily. 16 g Hall-Potvin, [place], PA-C   cetirizine (ZYRTEC ALLERGY) 10 MG tablet Take 1 tablet (10 mg total) by mouth daily. 30 tablet Hall-Potvin, [place], PA-C   benzonatate (TESSALON) 100 MG capsule Take 1 capsule (100 mg total) by mouth every 8 (eight) hours. 21 capsule Hall-Potvin, [place], PA-C   promethazine-dextromethorphan (PROMETHAZINE-DM) 6.25-15 MG/5ML syrup Take 5 mLs by mouth at bedtime as needed for cough. 118 mL Hall-Potvin, [place], PA-C   albuterol (VENTOLIN HFA) 108 (90 Base) MCG/ACT inhaler Inhale 2 puffs into the lungs every 4 (four) hours as needed for wheezing or shortness of breath. 18 g Hall-Potvin, [place], PA-C     PDMP not reviewed this encounter.   Hall-Potvin, [place], [place] 01/22/20 1541

## 2020-01-23 LAB — SARS-COV-2, NAA 2 DAY TAT

## 2020-01-23 LAB — NOVEL CORONAVIRUS, NAA: SARS-CoV-2, NAA: NOT DETECTED

## 2020-03-12 ENCOUNTER — Ambulatory Visit: Payer: No Typology Code available for payment source | Admitting: Podiatry

## 2020-03-13 ENCOUNTER — Encounter: Payer: Self-pay | Admitting: Podiatry

## 2020-03-13 ENCOUNTER — Ambulatory Visit (INDEPENDENT_AMBULATORY_CARE_PROVIDER_SITE_OTHER): Payer: No Typology Code available for payment source

## 2020-03-13 ENCOUNTER — Other Ambulatory Visit: Payer: Self-pay

## 2020-03-13 ENCOUNTER — Ambulatory Visit (INDEPENDENT_AMBULATORY_CARE_PROVIDER_SITE_OTHER): Payer: No Typology Code available for payment source | Admitting: Podiatry

## 2020-03-13 ENCOUNTER — Other Ambulatory Visit: Payer: Self-pay | Admitting: Podiatry

## 2020-03-13 VITALS — Temp 97.6°F

## 2020-03-13 DIAGNOSIS — M779 Enthesopathy, unspecified: Secondary | ICD-10-CM

## 2020-03-13 DIAGNOSIS — M778 Other enthesopathies, not elsewhere classified: Secondary | ICD-10-CM

## 2020-03-13 DIAGNOSIS — M79671 Pain in right foot: Secondary | ICD-10-CM | POA: Diagnosis not present

## 2020-03-13 DIAGNOSIS — M79672 Pain in left foot: Secondary | ICD-10-CM | POA: Diagnosis not present

## 2020-03-13 DIAGNOSIS — M205X9 Other deformities of toe(s) (acquired), unspecified foot: Secondary | ICD-10-CM

## 2020-03-13 NOTE — Progress Notes (Signed)
Subjective:   Patient ID: Meghan Gonzales, female   DOB: 49 y.o.   MRN: [id number]   HPI Patient presents stating she gets blisters on her heels and forefoot and states that she needs to lose some weight and she is trying to be active.  Patient does not smoke and likes to be active   Review of Systems  All other systems reviewed and are negative.       Objective:  Physical Exam Vitals and nursing note reviewed.  Constitutional:      Appearance: She is well-developed.  Pulmonary:     Effort: Pulmonary effort is normal.  Musculoskeletal:        General: Normal range of motion.  Skin:    General: Skin is warm.  Neurological:     Mental Status: She is alert.     Neurovascular status found to be intact muscle strength found to be adequate range of motion within normal limits.  Patient does have some what appears to be history of discoloration heels forefoot bilateral and does have hallux limitus of a functional nature bilateral with mild structural changes.  Patient is found to have good digital perfusion well oriented x3     Assessment:  Functional hallux limitus deformity bilateral along with history of Raynaud's with possible blistering     Plan:  H&P reviewed x-rays condition and at this point we casted for functional orthotics to try to take some of the pressure off the big toe joint and possibly reduce blistering along with powders.  I advised her on all this do not have any other treatments currently.  Patient will be seen back for orthotic dispensing and will try powders prior to that time  X-rays indicate that there is spurring of the first metatarsal head bilateral with elevation of the bone structure with no other pathology noted

## 2020-04-01 ENCOUNTER — Other Ambulatory Visit: Payer: Self-pay

## 2020-04-01 ENCOUNTER — Ambulatory Visit: Payer: No Typology Code available for payment source | Admitting: Orthotics

## 2020-04-01 DIAGNOSIS — M79671 Pain in right foot: Secondary | ICD-10-CM

## 2020-04-01 DIAGNOSIS — M79672 Pain in left foot: Secondary | ICD-10-CM

## 2020-04-01 DIAGNOSIS — M205X9 Other deformities of toe(s) (acquired), unspecified foot: Secondary | ICD-10-CM

## 2020-04-01 NOTE — Progress Notes (Signed)
Patient came in today to pick up custom made foot orthotics.  The goals were accomplished and the patient reported no dissatisfaction with said orthotics.  Patient was advised of breakin period and how to report any issues. 

## 2020-10-03 ENCOUNTER — Encounter: Payer: Self-pay | Admitting: Gastroenterology

## 2020-11-26 ENCOUNTER — Telehealth: Payer: Self-pay

## 2020-11-26 NOTE — Telephone Encounter (Signed)
Dr. Fuller Plan-  patient seen in for televist on 09/2018 for constipation and rectal bleeding- Colon order- patient declined at this time-in OV notation,  patient advised for OV to be scheduled for further eval if colon not scheduled at that time  Recall assessment sheet states colon recall 03/2022  Patient is on schedule for recall colon   Does patient need an OV prior to colon? Please advise

## 2020-11-27 NOTE — Telephone Encounter (Signed)
OK for a direct schedule colonoscopy.

## 2020-12-01 NOTE — Telephone Encounter (Signed)
Noted for PV  

## 2020-12-04 ENCOUNTER — Other Ambulatory Visit: Payer: Self-pay

## 2020-12-04 ENCOUNTER — Ambulatory Visit (AMBULATORY_SURGERY_CENTER): Payer: Self-pay

## 2020-12-04 VITALS — Ht 71.0 in | Wt 180.6 lb

## 2020-12-04 DIAGNOSIS — Z1211 Encounter for screening for malignant neoplasm of colon: Secondary | ICD-10-CM

## 2020-12-04 MED ORDER — PEG 3350-KCL-NA BICARB-NACL 420 G PO SOLR: 4000.0000 mL | Freq: Once | ORAL | 0 refills | Status: AC

## 2020-12-04 NOTE — Progress Notes (Signed)

## 2020-12-15 ENCOUNTER — Encounter: Payer: Self-pay | Admitting: Gastroenterology

## 2020-12-17 ENCOUNTER — Other Ambulatory Visit: Payer: Self-pay

## 2020-12-17 ENCOUNTER — Ambulatory Visit (AMBULATORY_SURGERY_CENTER): Payer: No Typology Code available for payment source | Admitting: Gastroenterology

## 2020-12-17 ENCOUNTER — Encounter: Payer: Self-pay | Admitting: Gastroenterology

## 2020-12-17 VITALS — BP 102/61 | HR 60 | Temp 97.5°F | Resp 11 | Ht 71.0 in | Wt 180.0 lb

## 2020-12-17 DIAGNOSIS — D128 Benign neoplasm of rectum: Secondary | ICD-10-CM

## 2020-12-17 DIAGNOSIS — K621 Rectal polyp: Secondary | ICD-10-CM | POA: Diagnosis not present

## 2020-12-17 DIAGNOSIS — Z1211 Encounter for screening for malignant neoplasm of colon: Secondary | ICD-10-CM | POA: Diagnosis not present

## 2020-12-17 MED ORDER — SODIUM CHLORIDE 0.9 % IV SOLN: 500.0000 mL | Freq: Once | INTRAVENOUS | Status: DC

## 2020-12-17 NOTE — Progress Notes (Signed)
CW - VS  Pt's states no medical or surgical changes since previsit or office visit.

## 2020-12-17 NOTE — Patient Instructions (Signed)
Thank you for allowing [place] to care for you today!  Await final results of polyp removed, approximately 7-10 days.  Will make recommendation at that time for your next colonoscopy.  Resume previous diet and medications today.  Return to normal daily activities tomorrow, 12/18/2020.    YOU HAD AN ENDOSCOPIC PROCEDURE TODAY AT [facility]:   Refer to the procedure report that was given to you for any specific questions about what was found during the examination.  If the procedure report does not answer your questions, please call your gastroenterologist to clarify.  If you requested that your care partner not be given the details of your procedure findings, then the procedure report has been included in a sealed envelope for you to review at your convenience later.  YOU SHOULD EXPECT: Some feelings of bloating in the abdomen. Passage of more gas than usual.  Walking can help get rid of the air that was put into your GI tract during the procedure and reduce the bloating. If you had a lower endoscopy (such as a colonoscopy or flexible sigmoidoscopy) you may notice spotting of blood in your stool or on the toilet paper. If you underwent a bowel prep for your procedure, you may not have a normal bowel movement for a few days.  Please Note:  You might notice some irritation and congestion in your nose or some drainage.  This is from the oxygen used during your procedure.  There is no need for concern and it should clear up in a day or so.  SYMPTOMS TO REPORT IMMEDIATELY:   Following lower endoscopy (colonoscopy or flexible sigmoidoscopy):  Excessive amounts of blood in the stool  Significant tenderness or worsening of abdominal pains  Swelling of the abdomen that is new, acute  Fever of 100F or higher   For urgent or emergent issues, a gastroenterologist can be reached at any hour by calling [phone]. Do not use MyChart messaging for urgent concerns.    DIET:  We do  recommend a small meal at first, but then you may proceed to your regular diet.  Drink plenty of fluids but you should avoid alcoholic beverages for 24 hours.  ACTIVITY:  You should plan to take it easy for the rest of today and you should NOT DRIVE or use heavy machinery until tomorrow (because of the sedation medicines used during the test).    FOLLOW UP: Our staff will call the number listed on your records 48-72 hours following your procedure to check on you and address any questions or concerns that you may have regarding the information given to you following your procedure. If we do not reach you, we will leave a message.  We will attempt to reach you two times.  During this call, we will ask if you have developed any symptoms of COVID 19. If you develop any symptoms (ie: fever, flu-like symptoms, shortness of breath, cough etc.) before then, please call [phone].  If you test positive for Covid 19 in the 2 weeks post procedure, please call and report this information to [place].    If any biopsies were taken you will be contacted by phone or by letter within the next 1-3 weeks.  Please call us at [phone] if you have not heard about the biopsies in 3 weeks.    SIGNATURES/CONFIDENTIALITY: You and/or your care partner have signed paperwork which will be entered into your electronic medical record.  These signatures attest to the fact that  that the information above on your After Visit Summary has been reviewed and is understood.  Full responsibility of the confidentiality of this discharge information lies with you and/or your care-partner.

## 2020-12-17 NOTE — Progress Notes (Signed)
Called to room to assist during endoscopic procedure.  Patient ID and intended procedure confirmed with present staff. Received instructions for my participation in the procedure from the performing physician.  

## 2020-12-17 NOTE — Op Note (Signed)
[facility] Patient Name: Meghan Gonzales Procedure Date: 12/17/2020 8:40 AM MRN: [id number] Endoscopist: Ladene Artist , MD Age: 50 Referring MD:  Date of Birth: Aug 08, 1970 Gender: Female Account #: [id number] Procedure:                Colonoscopy Indications:              Screening for colorectal malignant neoplasm Medicines:                Monitored Anesthesia Care Procedure:                Pre-Anesthesia Assessment:                           - Prior to the procedure, a History and Physical                            was performed, and patient medications and                            allergies were reviewed. The patient's tolerance of                            previous anesthesia was also reviewed. The risks                            and benefits of the procedure and the sedation                            options and risks were discussed with the patient.                            All questions were answered, and informed consent                            was obtained. Prior Anticoagulants: The patient has                            taken no previous anticoagulant or antiplatelet                            agents. ASA Grade Assessment: II - A patient with                            mild systemic disease. After reviewing the risks                            and benefits, the patient was deemed in                            satisfactory condition to undergo the procedure.                           After obtaining informed consent, the colonoscope  was passed under direct vision. Throughout the                            procedure, the patient's blood pressure, pulse, and                            oxygen saturations were monitored continuously. The                            Olympus PCF-H190DL ([id number]) Colonoscope was                            introduced through the anus and advanced to the the                            cecum,  identified by appendiceal orifice and                            ileocecal valve. The ileocecal valve, appendiceal                            orifice, and rectum were photographed. The quality                            of the bowel preparation was excellent. The                            colonoscopy was performed without difficulty. The                            patient tolerated the procedure well. Scope In: 8:43:06 AM Scope Out: 9:02:46 AM Scope Withdrawal Time: 0 hours 15 minutes 3 seconds  Total Procedure Duration: 0 hours 19 minutes 40 seconds  Findings:                 The perianal and digital rectal examinations were                            normal.                           A 5 mm polyp was found in the rectum. The polyp was                            sessile. The polyp was removed with a cold snare.                            Resection and retrieval were complete.                           Internal hemorrhoids were found during                            retroflexion. The hemorrhoids were small and Grade  I (internal hemorrhoids that do not prolapse).                           The exam was otherwise without abnormality on                            direct and retroflexion views. Complications:            No immediate complications. Estimated blood loss:                            None. Estimated Blood Loss:     Estimated blood loss: none. Impression:               - One 5 mm polyp in the rectum, removed with a cold                            snare. Resected and retrieved.                           - Internal hemorrhoids.                           - The examination was otherwise normal on direct                            and retroflexion views. Recommendation:           - Repeat colonoscopy after studies are complete for                            surveillance based on pathology results.                           - Patient has a contact number  available for                            emergencies. The signs and symptoms of potential                            delayed complications were discussed with the                            patient. Return to normal activities tomorrow.                            Written discharge instructions were provided to the                            patient.                           - Resume previous diet.                           - Continue present medications.                           -  Await pathology results. Ladene Artist, MD 12/17/2020 9:10:44 AM This report has been signed electronically.

## 2020-12-17 NOTE — Progress Notes (Signed)
To PACU, VSS. Report to [email or web address] 

## 2020-12-19 ENCOUNTER — Encounter: Payer: Self-pay | Admitting: Gastroenterology

## 2020-12-19 ENCOUNTER — Telehealth: Payer: Self-pay | Admitting: *Deleted

## 2020-12-19 NOTE — Telephone Encounter (Signed)
First attempt, left VM.  

## 2024-01-24 ENCOUNTER — Ambulatory Visit (INDEPENDENT_AMBULATORY_CARE_PROVIDER_SITE_OTHER): Payer: Self-pay | Admitting: Sports Medicine

## 2024-01-24 ENCOUNTER — Other Ambulatory Visit: Payer: Self-pay

## 2024-01-24 VITALS — BP 112/62 | Ht 71.0 in | Wt 168.0 lb

## 2024-01-24 DIAGNOSIS — M25562 Pain in left knee: Secondary | ICD-10-CM

## 2024-01-24 NOTE — Assessment & Plan Note (Signed)
 Based on her history and exam I believe that her current leg was probably triggered by doing squats that were too deep  I also think that kneeling on her knee is causing some of her discomfort and some of the calcification in the patellar tendon  I suggested modification of her exercise regimen She has weak hip abduction on the left only I suggested home exercises to strengthen this Icing after activity Avoiding any deep knee bends during squats particular when using weights  Recheck in about 2 months

## 2024-01-24 NOTE — Progress Notes (Signed)
 Chief complaint left knee pain  Patient comes to see me with left knee pain that started without a specific injury.  The pain is primarily in the anterior knee and the lateral knee.  The area just below the lateral knee feels somewhat numb.  She notices the pain worse when she is kneeling on a yoga mat.  She occasionally may feel some slight pain when doing deep squats with weights and this she feels more laterally. She does not recall a significant twisting injury or other injury that triggered the pain.  Other review she has not noted any swelling, locking or giving way.  She is very physically active and does weights classes walks etc.  Physical exam Pleasant white female who looks younger than her stated age BP 112/62   Ht 5' 11 (1.803 m)   Wt 168 lb (76.2 kg)   BMI 23.43 kg/m   Knee: Left Normal to inspection with no erythema or effusion or obvious bony abnormalities. Palpation normal with no warmth or joint line tenderness or patellar tenderness or condyle tenderness. ROM normal in flexion and extension and lower leg rotation. Ligaments with solid consistent endpoints including ACL, PCL, LCL, MCL. Negative Mcmurray's and provocative meniscal tests. Non painful patellar compression. Patellar and quadriceps tendons unremarkable. Hamstring and quadriceps strength is normal. No parts of the exam causes any significant pain  Ultrasound of left knee Small effusion is present in the left suprapatellar pouch and is not seen on the right Lateral meniscus is bulging but appears intact There is 1 area of calcification within the meniscus Medial meniscus is normal Quadriceps tendon is normal Patellar tendon shows some mild calcification near the tibial tubercle insertion  Impression: Bulging lateral meniscus and small effusion  Ultrasound and interpretation by Helene NOVAK. Harvey, MD

## 2024-06-01 ENCOUNTER — Other Ambulatory Visit: Payer: Self-pay | Admitting: Obstetrics and Gynecology

## 2024-06-01 DIAGNOSIS — R928 Other abnormal and inconclusive findings on diagnostic imaging of breast: Secondary | ICD-10-CM

## 2024-06-19 ENCOUNTER — Other Ambulatory Visit

## 2024-06-19 ENCOUNTER — Inpatient Hospital Stay: Admission: RE | Admit: 2024-06-19 | Source: Ambulatory Visit

## 2024-06-19 ENCOUNTER — Encounter

## 2024-06-25 ENCOUNTER — Ambulatory Visit
Admission: RE | Admit: 2024-06-25 | Discharge: 2024-06-25 | Disposition: A | Source: Ambulatory Visit | Attending: Obstetrics and Gynecology | Admitting: Obstetrics and Gynecology

## 2024-06-25 DIAGNOSIS — R928 Other abnormal and inconclusive findings on diagnostic imaging of breast: Secondary | ICD-10-CM

## 2024-07-10 ENCOUNTER — Encounter

## 2024-07-10 ENCOUNTER — Other Ambulatory Visit
